# Patient Record
Sex: Female | Born: 1965 | Race: White | Hispanic: No | Marital: Single | Smoking: Never smoker
Health system: Southern US, Community
[De-identification: ages and names within clinical notes are randomized; demographics above are authoritative.]

## PROBLEM LIST (undated history)

## (undated) DIAGNOSIS — T4145XA Adverse effect of unspecified anesthetic, initial encounter: Secondary | ICD-10-CM

## (undated) DIAGNOSIS — Z9889 Other specified postprocedural states: Secondary | ICD-10-CM

## (undated) DIAGNOSIS — E079 Disorder of thyroid, unspecified: Secondary | ICD-10-CM

## (undated) DIAGNOSIS — T8859XA Other complications of anesthesia, initial encounter: Secondary | ICD-10-CM

## (undated) DIAGNOSIS — I10 Essential (primary) hypertension: Secondary | ICD-10-CM

## (undated) DIAGNOSIS — R112 Nausea with vomiting, unspecified: Secondary | ICD-10-CM

## (undated) DIAGNOSIS — E039 Hypothyroidism, unspecified: Secondary | ICD-10-CM

## (undated) HISTORY — PX: ABDOMINAL HYSTERECTOMY: SHX81

## (undated) HISTORY — DX: Essential (primary) hypertension: I10

## (undated) HISTORY — DX: Disorder of thyroid, unspecified: E07.9

---

## 2004-08-24 ENCOUNTER — Ambulatory Visit: Payer: Self-pay | Admitting: Family Medicine

## 2005-01-25 ENCOUNTER — Ambulatory Visit: Payer: Self-pay | Admitting: Internal Medicine

## 2006-03-21 ENCOUNTER — Ambulatory Visit: Payer: Self-pay | Admitting: Internal Medicine

## 2006-08-05 ENCOUNTER — Ambulatory Visit: Payer: Self-pay | Admitting: Internal Medicine

## 2007-03-17 ENCOUNTER — Ambulatory Visit: Payer: Self-pay | Admitting: Family Medicine

## 2007-03-28 ENCOUNTER — Ambulatory Visit: Payer: Self-pay | Admitting: Internal Medicine

## 2007-06-05 ENCOUNTER — Ambulatory Visit: Payer: Self-pay | Admitting: Unknown Physician Specialty

## 2007-06-13 ENCOUNTER — Ambulatory Visit: Payer: Self-pay | Admitting: Unknown Physician Specialty

## 2008-04-04 ENCOUNTER — Ambulatory Visit: Payer: Self-pay | Admitting: Internal Medicine

## 2008-09-02 ENCOUNTER — Ambulatory Visit: Payer: Self-pay | Admitting: Unknown Physician Specialty

## 2008-09-12 ENCOUNTER — Ambulatory Visit: Payer: Self-pay | Admitting: Unknown Physician Specialty

## 2009-04-07 ENCOUNTER — Ambulatory Visit: Payer: Self-pay | Admitting: Internal Medicine

## 2010-06-09 ENCOUNTER — Ambulatory Visit: Payer: Self-pay | Admitting: Internal Medicine

## 2011-08-05 ENCOUNTER — Ambulatory Visit: Payer: Self-pay

## 2012-09-06 ENCOUNTER — Ambulatory Visit: Payer: Self-pay | Admitting: Internal Medicine

## 2012-09-08 ENCOUNTER — Ambulatory Visit: Payer: Self-pay | Admitting: Internal Medicine

## 2013-09-12 ENCOUNTER — Ambulatory Visit: Payer: Self-pay

## 2014-04-24 DIAGNOSIS — I340 Nonrheumatic mitral (valve) insufficiency: Secondary | ICD-10-CM | POA: Insufficient documentation

## 2014-08-21 DIAGNOSIS — N8185 Cervical stump prolapse: Secondary | ICD-10-CM | POA: Insufficient documentation

## 2014-08-21 DIAGNOSIS — B3731 Acute candidiasis of vulva and vagina: Secondary | ICD-10-CM | POA: Insufficient documentation

## 2014-08-21 DIAGNOSIS — B373 Candidiasis of vulva and vagina: Secondary | ICD-10-CM | POA: Insufficient documentation

## 2015-03-27 DIAGNOSIS — E782 Mixed hyperlipidemia: Secondary | ICD-10-CM | POA: Insufficient documentation

## 2015-03-27 DIAGNOSIS — I10 Essential (primary) hypertension: Secondary | ICD-10-CM | POA: Insufficient documentation

## 2017-06-07 ENCOUNTER — Ambulatory Visit: Payer: Self-pay | Admitting: Nurse Practitioner

## 2017-07-14 ENCOUNTER — Ambulatory Visit: Payer: BLUE CROSS/BLUE SHIELD | Admitting: Nurse Practitioner

## 2017-07-14 ENCOUNTER — Encounter: Payer: Self-pay | Admitting: Nurse Practitioner

## 2017-07-14 DIAGNOSIS — I10 Essential (primary) hypertension: Secondary | ICD-10-CM

## 2017-07-14 DIAGNOSIS — E079 Disorder of thyroid, unspecified: Secondary | ICD-10-CM | POA: Diagnosis not present

## 2017-07-14 NOTE — Progress Notes (Signed)
[facility] [place], [place] [address]  Internal MEDICINE  Office Visit Note  Patient Name: Theresa Yang  [id number]  [id number]  Date of Service: 07/24/2017  Chief Complaint  Patient presents with  . Hypertension    Hypertension  This is a chronic problem. The current episode started more than 1 year ago. The problem is unchanged. The problem is controlled. Pertinent negatives include no chest pain, neck pain, orthopnea, palpitations or shortness of breath. There are no associated agents to hypertension. Past treatments include calcium channel blockers, diuretics and lifestyle changes. The current treatment provides moderate improvement. Identifiable causes of hypertension include a thyroid problem.    Pt is here for routine follow up.    Current Medication: Outpatient Encounter Medications as of 07/14/2017  Medication Sig  . nystatin cream (MYCOSTATIN)   . desonide (DESOWEN) 0.05 % cream Apply topically.  Marland Kitchen levothyroxine (SYNTHROID, LEVOTHROID) 50 MCG tablet   . telmisartan-hydrochlorothiazide (MICARDIS HCT) 80-12.5 MG tablet    No facility-administered encounter medications on file as of 07/14/2017.     Surgical History: History reviewed. No pertinent surgical history.  Medical History: Past Medical History:  Diagnosis Date  . Hypertension   . Thyroid disease     Family History: Family History  Problem Relation Age of Onset  . Diabetes Mother   . Hypothyroidism Father   . Cancer Father     Social History   Socioeconomic History  . Marital status: Single    Spouse name: Not on file  . Number of children: Not on file  . Years of education: Not on file  . Highest education level: Not on file  Social Needs  . Financial resource strain: Not on file  . Food insecurity - worry: Not on file  . Food insecurity - inability: Not on file  . Transportation needs - medical: Not on file  . Transportation needs - non-medical: Not on  file  Occupational History  . Not on file  Tobacco Use  . Smoking status: Never Smoker  . Smokeless tobacco: Never Used  Substance and Sexual Activity  . Alcohol use: No    Frequency: Never  . Drug use: No  . Sexual activity: Not on file  Other Topics Concern  . Not on file  Social History Narrative  . Not on file      Review of Systems  Constitutional: Negative for activity change, chills, fatigue, fever and unexpected weight change.  HENT: Negative for congestion, postnasal drip, rhinorrhea, sneezing and sore throat.   Eyes: Negative.  Negative for redness.  Respiratory: Negative for cough, chest tightness and shortness of breath.   Cardiovascular: Negative for chest pain, palpitations and orthopnea.  Gastrointestinal: Negative for abdominal pain, constipation, diarrhea, nausea and vomiting.  Endocrine:       Well controlled hypothyroid  Genitourinary: Negative.  Negative for dysuria and frequency.  Musculoskeletal: Negative for arthralgias, back pain, joint swelling and neck pain.  Skin: Negative for rash.  Allergic/Immunologic: Negative for environmental allergies and immunocompromised state.  Neurological: Negative.  Negative for tremors and numbness.  Hematological: Negative for adenopathy. Does not bruise/bleed easily.  Psychiatric/Behavioral: Negative for behavioral problems (Depression), sleep disturbance and suicidal ideas. The patient is not nervous/anxious.     Today's Vitals   07/14/17 1608  BP: 119/65  Pulse: 74  Resp: 16  SpO2: 100%  Weight: 157 lb (71.2 kg)  Height: 5\' 2"  (1.575 m)    Physical Exam  Constitutional: She is oriented  to person, place, and time. She appears well-developed and well-nourished. No distress.  HENT:  Head: Normocephalic and atraumatic.  Mouth/Throat: Oropharynx is clear and moist. No oropharyngeal exudate.  Eyes: EOM are normal. Pupils are equal, round, and reactive to light.  Neck: Normal range of motion. Neck supple. No  JVD present. Carotid bruit is not present. No tracheal deviation present. No thyromegaly present.  Cardiovascular: Normal rate, regular rhythm and normal heart sounds. Exam reveals no gallop and no friction rub.  No murmur heard. Pulmonary/Chest: Effort normal and breath sounds normal. No respiratory distress. She has no wheezes. She has no rales. She exhibits no tenderness.  Abdominal: Soft. Bowel sounds are normal. There is no tenderness.  Musculoskeletal: Normal range of motion.  Lymphadenopathy:    She has no cervical adenopathy.  Neurological: She is alert and oriented to person, place, and time. No cranial nerve deficit.  Skin: Skin is warm and dry. She is not diaphoretic.  Psychiatric: She has a normal mood and affect. Her behavior is normal. Judgment and thought content normal.  Nursing note and vitals reviewed.  Assessment/Plan: 1. Essential hypertension Stable. Continue bp medication as prescribed.   2. Thyroid disease Continue levothyroxine as prescribed.   General Counseling: Theresa Yang understanding of the findings of todays visit and agrees with plan of treatment. I have discussed any further diagnostic evaluation that may be needed or ordered today. We also reviewed her medications today. she has been encouraged to call the office with any questions or concerns that should arise related to todays visit.    Time spent: 43 Minutes   Dr Lavera Guise Internal medicine

## 2017-07-24 ENCOUNTER — Encounter: Payer: Self-pay | Admitting: Nurse Practitioner

## 2017-07-24 DIAGNOSIS — I10 Essential (primary) hypertension: Secondary | ICD-10-CM | POA: Insufficient documentation

## 2017-07-24 DIAGNOSIS — E079 Disorder of thyroid, unspecified: Secondary | ICD-10-CM | POA: Insufficient documentation

## 2017-09-01 ENCOUNTER — Ambulatory Visit: Payer: BLUE CROSS/BLUE SHIELD | Admitting: Nurse Practitioner

## 2017-09-01 ENCOUNTER — Encounter: Payer: Self-pay | Admitting: Nurse Practitioner

## 2017-09-01 VITALS — BP 129/67 | HR 73 | Resp 16 | Ht 62.0 in | Wt 157.6 lb

## 2017-09-01 DIAGNOSIS — Z124 Encounter for screening for malignant neoplasm of cervix: Secondary | ICD-10-CM | POA: Diagnosis not present

## 2017-09-01 DIAGNOSIS — N8110 Cystocele, unspecified: Secondary | ICD-10-CM

## 2017-09-01 DIAGNOSIS — R300 Dysuria: Secondary | ICD-10-CM | POA: Diagnosis not present

## 2017-09-01 DIAGNOSIS — E559 Vitamin D deficiency, unspecified: Secondary | ICD-10-CM | POA: Diagnosis not present

## 2017-09-01 DIAGNOSIS — Z0001 Encounter for general adult medical examination with abnormal findings: Secondary | ICD-10-CM

## 2017-09-01 DIAGNOSIS — I10 Essential (primary) hypertension: Secondary | ICD-10-CM

## 2017-09-01 DIAGNOSIS — E079 Disorder of thyroid, unspecified: Secondary | ICD-10-CM | POA: Diagnosis not present

## 2017-09-01 DIAGNOSIS — Z1211 Encounter for screening for malignant neoplasm of colon: Secondary | ICD-10-CM

## 2017-09-01 DIAGNOSIS — B372 Candidiasis of skin and nail: Secondary | ICD-10-CM

## 2017-09-01 MED ORDER — NYSTATIN 100000 UNIT/GM EX CREA: TOPICAL_CREAM | Freq: Three times a day (TID) | CUTANEOUS | 3 refills | Status: DC

## 2017-09-01 NOTE — Progress Notes (Signed)
[facility] [place], [place] [address]  Internal MEDICINE  Office Visit Note  Patient Name: Theresa Yang  [id number]  [id number]  Date of Service: 09/23/2017   Pt is here for routine health maintenance examination  Chief Complaint  Patient presents with  . Annual Exam  . Gynecologic Exam  . Hypertension     Hypertension  This is a chronic problem. The current episode started more than 1 year ago. The problem has been gradually improving since onset. The problem is controlled. Pertinent negatives include no chest pain, headaches, neck pain, palpitations or shortness of breath. Agents associated with hypertension include thyroid hormones. Risk factors for coronary artery disease include sedentary lifestyle and obesity. Past treatments include angiotensin blockers and diuretics. The current treatment provides moderate improvement. Compliance problems include exercise.      Current Medication: Outpatient Encounter Medications as of 09/01/2017  Medication Sig  . desonide (DESOWEN) 0.05 % cream Apply topically.  Marland Kitchen levothyroxine (SYNTHROID, LEVOTHROID) 50 MCG tablet   . nystatin cream (MYCOSTATIN) Apply topically 3 (three) times daily.  Marland Kitchen telmisartan-hydrochlorothiazide (MICARDIS HCT) 80-12.5 MG tablet   . [DISCONTINUED] nystatin cream (MYCOSTATIN)    No facility-administered encounter medications on file as of 09/01/2017.     Surgical History: History reviewed. No pertinent surgical history.  Medical History: Past Medical History:  Diagnosis Date  . Hypertension   . Thyroid disease     Family History: Family History  Problem Relation Age of Onset  . Diabetes Mother   . Hypothyroidism Father   . Cancer Father       Review of Systems  Constitutional: Negative for activity change, chills, fatigue, fever and unexpected weight change.  HENT: Negative for congestion, postnasal drip, rhinorrhea, sneezing, sore throat and voice change.    Eyes: Negative.  Negative for redness.  Respiratory: Negative for cough, chest tightness, shortness of breath and wheezing.   Cardiovascular: Negative for chest pain, palpitations and leg swelling.  Gastrointestinal: Negative for abdominal pain, constipation, diarrhea, nausea and vomiting.  Endocrine:       Well controlled hypothyroid  Genitourinary: Negative for dysuria, flank pain, frequency, hematuria and vaginal discharge.  Musculoskeletal: Negative for arthralgias, back pain, joint swelling and neck pain.  Skin: Negative for rash.  Allergic/Immunologic: Negative for environmental allergies and immunocompromised state.  Neurological: Negative.  Negative for tremors, numbness and headaches.  Hematological: Negative for adenopathy. Does not bruise/bleed easily.  Psychiatric/Behavioral: Negative for behavioral problems (Depression), sleep disturbance and suicidal ideas. The patient is not nervous/anxious.      Vital Signs: BP 129/67 (BP Location: Right Arm, Patient Position: Sitting, Cuff Size: Normal)   Pulse 73   Resp 16   Ht 5\' 2"  (1.575 m)   Wt 157 lb 9.6 oz (71.5 kg)   SpO2 99%   BMI 28.83 kg/m    Physical Exam  Constitutional: She is oriented to person, place, and time. She appears well-developed and well-nourished. No distress.  HENT:  Head: Normocephalic and atraumatic.  Mouth/Throat: Oropharynx is clear and moist. No oropharyngeal exudate.  Eyes: Pupils are equal, round, and reactive to light. EOM are normal.  Neck: Normal range of motion. Neck supple. No JVD present. Carotid bruit is not present. No tracheal deviation present. No thyromegaly present.  Cardiovascular: Normal rate, regular rhythm, normal heart sounds and intact distal pulses. Exam reveals no gallop and no friction rub.  No murmur heard. Pulmonary/Chest: Effort normal and breath sounds normal. No respiratory distress. She has no wheezes.  She has no rales. She exhibits no tenderness. Right breast exhibits  no inverted nipple, no mass, no nipple discharge, no skin change and no tenderness. Left breast exhibits no inverted nipple, no mass, no nipple discharge, no skin change and no tenderness. Breasts are symmetrical.  Abdominal: Soft. Bowel sounds are normal. There is no tenderness.  Genitourinary: Vagina normal and uterus normal. Pelvic exam was performed with patient supine. There is no rash, tenderness, lesion or injury on the right labia. There is no rash, tenderness, lesion or injury on the left labia. Cervix exhibits no motion tenderness, no discharge and no friability. Right adnexum displays no mass and no tenderness. Left adnexum displays no mass and no tenderness.  Genitourinary Comments: No tenderness, masses, or organomegaly present during bimanual exam. Moderate bladder prolapse present.   Musculoskeletal: Normal range of motion.  Lymphadenopathy:    She has no cervical adenopathy.  Neurological: She is alert and oriented to person, place, and time. No cranial nerve deficit.  Skin: Skin is warm and dry. She is not diaphoretic.  Psychiatric: She has a normal mood and affect. Her behavior is normal. Judgment and thought content normal.  Nursing note and vitals reviewed.  LABS: Recent Results (from the past 2160 hour(s))  Pap IG and HPV (high risk) DNA detection     Status: None   Collection Time: 09/01/17 11:34 AM  Result Value Ref Range   DIAGNOSIS: Comment     Comment: NEGATIVE FOR INTRAEPITHELIAL LESION OR MALIGNANCY.   Specimen adequacy: Comment     Comment: Satisfactory for evaluation. Endocervical and/or squamous metaplastic cells (endocervical component) are present.    Clinician Provided ICD10 Comment     Comment: Z12.4   Performed by: Comment     Comment: Shon Baton, Cytotechnologist (ASCP)   PAP Smear Comment .    Note: Comment     Comment: The Pap smear is a screening test designed to aid in the detection of premalignant and malignant conditions of the uterine cervix.   It is not a diagnostic procedure and should not be used as the sole means of detecting cervical cancer.  Both false-positive and false-negative reports do occur.    Test Methodology Comment     Comment: This liquid based ThinPrep(R) pap test was screened with the use of an image guided system.    HPV, high-risk Negative Negative    Comment: This high-risk HPV test detects thirteen high-risk types (16/18/31/33/35/39/45/51/52/56/58/59/68) without differentiation.   Urinalysis, Routine w reflex microscopic     Status: Abnormal   Collection Time: 09/01/17 11:35 AM  Result Value Ref Range   Specific Gravity, UA 1.005 1.005 - 1.030   pH, UA 7.5 5.0 - 7.5   Color, UA Yellow Yellow   Appearance Ur Clear Clear   Leukocytes, UA 2+ (A) Negative   Protein, UA Negative Negative/Trace   Glucose, UA Negative Negative   Ketones, UA Negative Negative   RBC, UA Negative Negative   Bilirubin, UA Negative Negative   Urobilinogen, Ur 0.2 0.2 - 1.0 mg/dL   Nitrite, UA Negative Negative   Microscopic Examination See below:     Comment: Microscopic was indicated and was performed.  Microscopic Examination     Status: None   Collection Time: 09/01/17 11:35 AM  Result Value Ref Range   WBC, UA 0-5 0 - 5 /hpf   RBC, UA 0-2 0 - 2 /hpf   Epithelial Cells (non renal) 0-10 0 - 10 /hpf   Casts None seen None  seen /lpf   Mucus, UA Present Not Estab.   Bacteria, UA Few None seen/Few    Assessment/Plan: 1. Encounter for general adult medical examination with abnormal findings Annual wellness visit today. - CBC with Differential/Platelet - Comprehensive metabolic panel - T4, free - TSH - Lipid panel - Vitamin D 1,25 dihydroxy  2. Thyroid disease Check thyroid panel and adjust levothyroxine as indicated.  - T4, free - TSH  3. Essential hypertension Improved. Continue bp medication as prescribed.  - Comprehensive metabolic panel - Lipid panel  4. Bladder prolapse, female, acquired Not  currently causing any problems. Will monitor for now. reffer to urogynecology as indicated.   5. Cutaneous candidiasis - nystatin cream (MYCOSTATIN); Apply topically 3 (three) times daily.  Dispense: 30 g; Refill: 3  6. Dysuria - Urinalysis, Routine w reflex microscopic  7. Routine cervical smear - Pap IG and HPV (high risk) DNA detection  8. Vitamin D deficiency - Vitamin D 1,25 dihydroxy  General Counseling: Loraine verbalizes understanding of the findings of todays visit and agrees with plan of treatment. I have discussed any further diagnostic evaluation that may be needed or ordered today. We also reviewed her medications today. she has been encouraged to call the office with any questions or concerns that should arise related to todays visit.  Hypertension Counseling:   The following hypertensive lifestyle modification were recommended and discussed:  1. Limiting alcohol intake to less than 1 oz/day of ethanol:(24 oz of beer or 8 oz of wine or 2 oz of 100-proof whiskey). 2. Take baby ASA 81 mg daily. 3. Importance of regular aerobic exercise and losing weight. 4. Reduce dietary saturated fat and cholesterol intake for overall cardiovascular health. 5. Maintaining adequate dietary potassium, calcium, and magnesium intake. 6. Regular monitoring of the blood pressure. 7. Reduce sodium intake to less than 100 mmol/day (less than 2.3 gm of sodium or less than 6 gm of sodium choride)   This patient was seen by Leretha Pol, FNP- C in Collaboration with Dr Lavera Guise as a part of collaborative care agreement    Orders Placed This Encounter  Procedures  . Microscopic Examination  . Urinalysis, Routine w reflex microscopic  . CBC with Differential/Platelet  . Comprehensive metabolic panel  . T4, free  . TSH  . Lipid panel  . Vitamin D 1,25 dihydroxy    Meds ordered this encounter  Medications  . nystatin cream (MYCOSTATIN)    Sig: Apply topically 3 (three) times daily.     Dispense:  30 g    Refill:  3    Order Specific Question:   Supervising Provider    Answer:   Lavera Guise [[id number]]    Time spent: [place], MD  Internal Medicine

## 2017-09-02 LAB — MICROSCOPIC EXAMINATION: Casts: NONE SEEN /lpf

## 2017-09-02 LAB — URINALYSIS, ROUTINE W REFLEX MICROSCOPIC
BILIRUBIN UA: NEGATIVE
Glucose, UA: NEGATIVE
KETONES UA: NEGATIVE
Nitrite, UA: NEGATIVE
Protein, UA: NEGATIVE
RBC UA: NEGATIVE
SPEC GRAV UA: 1.005 (ref 1.005–1.030)
UUROB: 0.2 mg/dL (ref 0.2–1.0)
pH, UA: 7.5 (ref 5.0–7.5)

## 2017-09-03 LAB — PAP IG AND HPV HIGH-RISK
HPV, high-risk: NEGATIVE
PAP Smear Comment: 0

## 2017-09-12 NOTE — Progress Notes (Signed)
Pt was notified.  

## 2017-09-23 DIAGNOSIS — R300 Dysuria: Secondary | ICD-10-CM | POA: Insufficient documentation

## 2017-09-23 DIAGNOSIS — B372 Candidiasis of skin and nail: Secondary | ICD-10-CM | POA: Insufficient documentation

## 2017-09-23 DIAGNOSIS — E559 Vitamin D deficiency, unspecified: Secondary | ICD-10-CM | POA: Insufficient documentation

## 2017-09-23 DIAGNOSIS — N8110 Cystocele, unspecified: Secondary | ICD-10-CM | POA: Insufficient documentation

## 2017-10-06 ENCOUNTER — Other Ambulatory Visit: Payer: Self-pay | Admitting: Nurse Practitioner

## 2017-10-07 LAB — COMPREHENSIVE METABOLIC PANEL
ALT: 22 IU/L (ref 0–32)
AST: 19 IU/L (ref 0–40)
Albumin/Globulin Ratio: 2 (ref 1.2–2.2)
Albumin: 4.5 g/dL (ref 3.5–5.5)
Alkaline Phosphatase: 66 IU/L (ref 39–117)
BUN/Creatinine Ratio: 15 (ref 9–23)
BUN: 14 mg/dL (ref 6–24)
Bilirubin Total: 0.5 mg/dL (ref 0.0–1.2)
CALCIUM: 9.7 mg/dL (ref 8.7–10.2)
CO2: 25 mmol/L (ref 20–29)
Chloride: 100 mmol/L (ref 96–106)
Creatinine, Ser: 0.93 mg/dL (ref 0.57–1.00)
GFR, EST AFRICAN AMERICAN: 83 mL/min/{1.73_m2} (ref >59)
GFR, EST NON AFRICAN AMERICAN: 72 mL/min/{1.73_m2} (ref >59)
Globulin, Total: 2.2 g/dL (ref 1.5–4.5)
Glucose: 92 mg/dL (ref 65–99)
POTASSIUM: 4.9 mmol/L (ref 3.5–5.2)
SODIUM: 139 mmol/L (ref 134–144)
Total Protein: 6.7 g/dL (ref 6.0–8.5)

## 2017-10-07 LAB — LIPID PANEL W/O CHOL/HDL RATIO
CHOLESTEROL TOTAL: 169 mg/dL (ref 100–199)
HDL: 52 mg/dL (ref >39)
LDL Calculated: 106 mg/dL — ABNORMAL HIGH (ref 0–99)
Triglycerides: 54 mg/dL (ref 0–149)
VLDL Cholesterol Cal: 11 mg/dL (ref 5–40)

## 2017-10-07 LAB — CBC
Hematocrit: 39.5 % (ref 34.0–46.6)
Hemoglobin: 13.5 g/dL (ref 11.1–15.9)
MCH: 32.4 pg (ref 26.6–33.0)
MCHC: 34.2 g/dL (ref 31.5–35.7)
MCV: 95 fL (ref 79–97)
PLATELETS: 191 10*3/uL (ref 150–379)
RBC: 4.17 x10E6/uL (ref 3.77–5.28)
RDW: 12.7 % (ref 12.3–15.4)
WBC: 5.1 10*3/uL (ref 3.4–10.8)

## 2017-10-07 LAB — VITAMIN D 25 HYDROXY (VIT D DEFICIENCY, FRACTURES): Vit D, 25-Hydroxy: 31.4 ng/mL (ref 30.0–100.0)

## 2017-10-07 LAB — TSH: TSH: 3.94 u[IU]/mL (ref 0.450–4.500)

## 2017-10-07 LAB — T4, FREE: Free T4: 1.33 ng/dL (ref 0.82–1.77)

## 2018-03-10 ENCOUNTER — Other Ambulatory Visit: Payer: Self-pay

## 2018-03-10 MED ORDER — LEVOTHYROXINE SODIUM 50 MCG PO TABS: 50.0000 ug | ORAL_TABLET | Freq: Every day | ORAL | 1 refills | Status: DC

## 2018-03-23 ENCOUNTER — Ambulatory Visit: Payer: Self-pay | Admitting: Nurse Practitioner

## 2018-03-30 ENCOUNTER — Encounter: Payer: Self-pay | Admitting: Nurse Practitioner

## 2018-03-30 ENCOUNTER — Ambulatory Visit: Payer: BLUE CROSS/BLUE SHIELD | Admitting: Nurse Practitioner

## 2018-03-30 VITALS — BP 109/63 | HR 61 | Resp 16 | Ht 62.0 in | Wt 163.2 lb

## 2018-03-30 DIAGNOSIS — Z1211 Encounter for screening for malignant neoplasm of colon: Secondary | ICD-10-CM | POA: Diagnosis not present

## 2018-03-30 DIAGNOSIS — I10 Essential (primary) hypertension: Secondary | ICD-10-CM

## 2018-03-30 DIAGNOSIS — E079 Disorder of thyroid, unspecified: Secondary | ICD-10-CM

## 2018-03-30 DIAGNOSIS — Z1239 Encounter for other screening for malignant neoplasm of breast: Secondary | ICD-10-CM

## 2018-03-30 NOTE — Progress Notes (Signed)
[facility] [place], [place] [address]  Internal MEDICINE  Office Visit Note  Patient Name: Theresa Yang  [id number]  [id number]  Date of Service: 03/30/2018  Chief Complaint  Patient presents with  . Medical Management of Chronic Issues    6 month follow up  . Hypertension  . Quality Metric Gaps    pt stated she will get the flu vaccine from her pharmacy    The patient is here for routine follow up visit. She is due to have screening mammogram. She also needs to have screening colonoscopy.  Hypertension  This is a chronic problem. The current episode started more than 1 year ago. The problem is unchanged. The problem is controlled. Pertinent negatives include no chest pain, headaches, neck pain, palpitations or shortness of breath. There are no associated agents to hypertension. Risk factors for coronary artery disease include obesity and post-menopausal state. Past treatments include angiotensin blockers. The current treatment provides moderate improvement. Compliance problems include exercise.        Current Medication: Outpatient Encounter Medications as of 03/30/2018  Medication Sig  . clotrimazole-betamethasone (LOTRISONE) cream Apply 1 application topically 2 (two) times daily.  Marland Kitchen desonide (DESOWEN) 0.05 % cream Apply topically.  . fluocinonide cream (LIDEX) 2.68 % Apply 1 application topically 2 (two) times daily.  Marland Kitchen levothyroxine (SYNTHROID, LEVOTHROID) 50 MCG tablet Take 1 tablet (50 mcg total) by mouth daily before breakfast.  . nystatin cream (MYCOSTATIN) Apply topically 3 (three) times daily.  Marland Kitchen telmisartan-hydrochlorothiazide (MICARDIS HCT) 80-12.5 MG tablet    No facility-administered encounter medications on file as of 03/30/2018.     Surgical History: History reviewed. No pertinent surgical history.  Medical History: Past Medical History:  Diagnosis Date  . Hypertension   . Thyroid disease     Family  History: Family History  Problem Relation Age of Onset  . Diabetes Mother   . Hypothyroidism Father   . Cancer Father     Social History   Socioeconomic History  . Marital status: Single    Spouse name: Not on file  . Number of children: Not on file  . Years of education: Not on file  . Highest education level: Not on file  Occupational History  . Not on file  Social Needs  . Financial resource strain: Not on file  . Food insecurity:    Worry: Not on file    Inability: Not on file  . Transportation needs:    Medical: Not on file    Non-medical: Not on file  Tobacco Use  . Smoking status: Never Smoker  . Smokeless tobacco: Never Used  Substance and Sexual Activity  . Alcohol use: No    Frequency: Never  . Drug use: No  . Sexual activity: Not on file  Lifestyle  . Physical activity:    Days per week: Not on file    Minutes per session: Not on file  . Stress: Not on file  Relationships  . Social connections:    Talks on phone: Not on file    Gets together: Not on file    Attends religious service: Not on file    Active member of club or organization: Not on file    Attends meetings of clubs or organizations: Not on file    Relationship status: Not on file  . Intimate partner violence:    Fear of current or ex partner: Not on file    Emotionally abused: Not on file  Physically abused: Not on file    Forced sexual activity: Not on file  Other Topics Concern  . Not on file  Social History Narrative  . Not on file      Review of Systems  Constitutional: Negative for activity change, chills, fatigue, fever and unexpected weight change.  HENT: Negative for congestion, postnasal drip, rhinorrhea, sneezing, sore throat and voice change.   Eyes: Negative.  Negative for redness.  Respiratory: Negative for cough, chest tightness, shortness of breath and wheezing.   Cardiovascular: Negative for chest pain, palpitations and leg swelling.  Gastrointestinal: Negative  for abdominal pain, constipation, diarrhea, nausea and vomiting.  Endocrine:       Recent thyroid panel was normal  Musculoskeletal: Negative for arthralgias, back pain, joint swelling and neck pain.  Skin: Negative for rash.  Allergic/Immunologic: Negative for environmental allergies and immunocompromised state.  Neurological: Negative.  Negative for tremors, numbness and headaches.  Hematological: Negative for adenopathy. Does not bruise/bleed easily.  Psychiatric/Behavioral: Negative for behavioral problems (Depression), sleep disturbance and suicidal ideas. The patient is not nervous/anxious.     Today's Vitals   03/30/18 1452  BP: 109/63  Pulse: 61  Resp: 16  SpO2: 98%  Weight: 163 lb 3.2 oz (74 kg)  Height: 5\' 2"  (1.575 m)    Physical Exam  Constitutional: She is oriented to person, place, and time. She appears well-developed and well-nourished. No distress.  HENT:  Head: Normocephalic and atraumatic.  Mouth/Throat: Oropharynx is clear and moist. No oropharyngeal exudate.  Eyes: Pupils are equal, round, and reactive to light. Conjunctivae and EOM are normal.  Neck: Normal range of motion. Neck supple. No JVD present. Carotid bruit is not present. No tracheal deviation present. No thyromegaly present.  Cardiovascular: Normal rate, regular rhythm and normal heart sounds. Exam reveals no gallop and no friction rub.  No murmur heard. Pulmonary/Chest: Effort normal and breath sounds normal. No respiratory distress. She has no wheezes. She has no rales. She exhibits no tenderness.  Abdominal: Soft. Bowel sounds are normal. There is no tenderness.  Musculoskeletal: Normal range of motion.  Lymphadenopathy:    She has no cervical adenopathy.  Neurological: She is alert and oriented to person, place, and time. No cranial nerve deficit.  Skin: Skin is warm and dry. She is not diaphoretic.  Psychiatric: She has a normal mood and affect. Her behavior is normal. Judgment and thought  content normal.  Nursing note and vitals reviewed.  Assessment/Plan: 1. Essential hypertension Stable. Continue bp medication as prescribed   2. Thyroid disease Recent thyroid panel normal. Continue levothyroxine at current dose   3. Screening for breast cancer - MM DIGITAL SCREENING BILATERAL; Future  4. Screening for colon cancer Refer to GI for screening colonoscopy - Ambulatory referral to Gastroenterology  General Counseling: [name] understanding of the findings of todays visit and agrees with plan of treatment. I have discussed any further diagnostic evaluation that may be needed or ordered today. We also reviewed her medications today. she has been encouraged to call the office with any questions or concerns that should arise related to todays visit.  Hypertension Counseling:   The following hypertensive lifestyle modification were recommended and discussed:  1. Limiting alcohol intake to less than 1 oz/day of ethanol:(24 oz of beer or 8 oz of wine or 2 oz of 100-proof whiskey). 2. Take baby ASA 81 mg daily. 3. Importance of regular aerobic exercise and losing weight. 4. Reduce dietary saturated fat and cholesterol intake for overall  cardiovascular health. 5. Maintaining adequate dietary potassium, calcium, and magnesium intake. 6. Regular monitoring of the blood pressure. 7. Reduce sodium intake to less than 100 mmol/day (less than 2.3 gm of sodium or less than 6 gm of sodium choride)   This patient was seen by [place] with Dr Lavera Guise as a part of collaborative care agreement  Orders Placed This Encounter  Procedures  . MM DIGITAL SCREENING BILATERAL  . Ambulatory referral to Gastroenterology    Time spent: [place] Internal medicine

## 2018-04-03 ENCOUNTER — Other Ambulatory Visit: Payer: Self-pay

## 2018-04-03 DIAGNOSIS — Z1211 Encounter for screening for malignant neoplasm of colon: Secondary | ICD-10-CM

## 2018-04-13 ENCOUNTER — Encounter (HOSPITAL_COMMUNITY): Payer: Self-pay

## 2018-04-13 ENCOUNTER — Ambulatory Visit
Admission: RE | Admit: 2018-04-13 | Discharge: 2018-04-13 | Disposition: A | Discharge status: Home / Self Care | Payer: BLUE CROSS/BLUE SHIELD | Source: Ambulatory Visit | Attending: Nurse Practitioner | Admitting: Nurse Practitioner

## 2018-04-13 ENCOUNTER — Other Ambulatory Visit: Payer: Self-pay | Admitting: Nurse Practitioner

## 2018-04-13 DIAGNOSIS — Z1239 Encounter for other screening for malignant neoplasm of breast: Secondary | ICD-10-CM | POA: Diagnosis present

## 2018-04-14 ENCOUNTER — Other Ambulatory Visit: Payer: Self-pay | Admitting: Nurse Practitioner

## 2018-04-14 DIAGNOSIS — N6320 Unspecified lump in the left breast, unspecified quadrant: Secondary | ICD-10-CM

## 2018-04-14 DIAGNOSIS — R928 Other abnormal and inconclusive findings on diagnostic imaging of breast: Secondary | ICD-10-CM

## 2018-04-20 ENCOUNTER — Ambulatory Visit: Admit: 2018-04-20 | Payer: BLUE CROSS/BLUE SHIELD | Admitting: Gastroenterology

## 2018-04-20 SURGERY — COLONOSCOPY WITH PROPOFOL
Anesthesia: General

## 2018-04-21 ENCOUNTER — Ambulatory Visit
Admission: RE | Admit: 2018-04-21 | Discharge: 2018-04-21 | Disposition: A | Discharge status: Home / Self Care | Payer: BLUE CROSS/BLUE SHIELD | Source: Ambulatory Visit | Attending: Nurse Practitioner | Admitting: Nurse Practitioner

## 2018-04-21 DIAGNOSIS — R928 Other abnormal and inconclusive findings on diagnostic imaging of breast: Secondary | ICD-10-CM

## 2018-04-21 DIAGNOSIS — N6320 Unspecified lump in the left breast, unspecified quadrant: Secondary | ICD-10-CM | POA: Diagnosis present

## 2018-06-06 ENCOUNTER — Telehealth: Payer: Self-pay | Admitting: Gastroenterology

## 2018-06-06 NOTE — Telephone Encounter (Signed)
Pt is calling to r/s her procedure

## 2018-06-07 NOTE — Telephone Encounter (Signed)
LVM asking patient to call office back to schedule Screening colonoscopy.  New Referral will need to be created.  Thanks [employer]

## 2018-06-08 ENCOUNTER — Other Ambulatory Visit: Payer: Self-pay

## 2018-06-08 DIAGNOSIS — Z1211 Encounter for screening for malignant neoplasm of colon: Secondary | ICD-10-CM

## 2018-07-03 ENCOUNTER — Other Ambulatory Visit: Payer: Self-pay

## 2018-07-03 MED ORDER — PEG 3350-KCL-NA BICARB-NACL 420 G PO SOLR: 4000.0000 mL | Freq: Once | ORAL | 0 refills | Status: AC

## 2018-07-12 ENCOUNTER — Ambulatory Visit: Payer: BLUE CROSS/BLUE SHIELD | Admitting: Certified Registered"

## 2018-07-12 ENCOUNTER — Other Ambulatory Visit: Payer: Self-pay

## 2018-07-12 ENCOUNTER — Encounter: Payer: Self-pay | Admitting: Anesthesiology

## 2018-07-12 ENCOUNTER — Ambulatory Visit
Admission: RE | Admit: 2018-07-12 | Discharge: 2018-07-12 | Disposition: A | Discharge status: Home / Self Care | Payer: BLUE CROSS/BLUE SHIELD | Attending: Gastroenterology | Admitting: Gastroenterology

## 2018-07-12 ENCOUNTER — Encounter
Admission: RE | Disposition: A | Discharge status: Home / Self Care | Payer: Self-pay | Source: Home / Self Care | Attending: Gastroenterology

## 2018-07-12 DIAGNOSIS — I10 Essential (primary) hypertension: Secondary | ICD-10-CM | POA: Diagnosis not present

## 2018-07-12 DIAGNOSIS — Z79899 Other long term (current) drug therapy: Secondary | ICD-10-CM | POA: Diagnosis not present

## 2018-07-12 DIAGNOSIS — D120 Benign neoplasm of cecum: Secondary | ICD-10-CM

## 2018-07-12 DIAGNOSIS — E039 Hypothyroidism, unspecified: Secondary | ICD-10-CM | POA: Insufficient documentation

## 2018-07-12 DIAGNOSIS — Z1211 Encounter for screening for malignant neoplasm of colon: Secondary | ICD-10-CM | POA: Diagnosis not present

## 2018-07-12 DIAGNOSIS — D122 Benign neoplasm of ascending colon: Secondary | ICD-10-CM | POA: Insufficient documentation

## 2018-07-12 DIAGNOSIS — Z79890 Hormone replacement therapy (postmenopausal): Secondary | ICD-10-CM | POA: Diagnosis not present

## 2018-07-12 HISTORY — DX: Adverse effect of unspecified anesthetic, initial encounter: T41.45XA

## 2018-07-12 HISTORY — DX: Other specified postprocedural states: Z98.890

## 2018-07-12 HISTORY — PX: COLONOSCOPY WITH PROPOFOL: SHX5780

## 2018-07-12 HISTORY — DX: Other complications of anesthesia, initial encounter: T88.59XA

## 2018-07-12 HISTORY — DX: Hypothyroidism, unspecified: E03.9

## 2018-07-12 HISTORY — DX: Other specified postprocedural states: R11.2

## 2018-07-12 LAB — HM COLONOSCOPY

## 2018-07-12 SURGERY — COLONOSCOPY WITH PROPOFOL
Anesthesia: General

## 2018-07-12 MED ORDER — PROPOFOL 10 MG/ML IV BOLUS
INTRAVENOUS | Status: DC | PRN
  Administered 2018-07-12 (×11): 20 mg via INTRAVENOUS
  Administered 2018-07-12: 30 mg via INTRAVENOUS
  Administered 2018-07-12: 20 mg via INTRAVENOUS
  Administered 2018-07-12: 50 mg via INTRAVENOUS

## 2018-07-12 MED ORDER — EPHEDRINE SULFATE 50 MG/ML IJ SOLN
INTRAMUSCULAR | Status: DC | PRN
  Administered 2018-07-12 (×3): 10 mg via INTRAVENOUS

## 2018-07-12 MED ORDER — GLYCOPYRROLATE 0.2 MG/ML IJ SOLN
INTRAMUSCULAR | Status: DC | PRN
  Administered 2018-07-12: 0.2 mg via INTRAVENOUS

## 2018-07-12 MED ORDER — SODIUM CHLORIDE 0.9 % IV SOLN
INTRAVENOUS | Status: DC
  Administered 2018-07-12: 13:00:00 via INTRAVENOUS

## 2018-07-12 MED ORDER — PHENYLEPHRINE HCL 10 MG/ML IJ SOLN
INTRAMUSCULAR | Status: DC | PRN
  Administered 2018-07-12: 100 ug via INTRAVENOUS

## 2018-07-12 NOTE — Anesthesia Post-op Follow-up Note (Signed)
Anesthesia QCDR form completed.        

## 2018-07-12 NOTE — Anesthesia Postprocedure Evaluation (Signed)
Anesthesia Post Note  Patient: Theresa Yang  Procedure(s) Performed: COLONOSCOPY WITH PROPOFOL (N/A )  Patient location during evaluation: Endoscopy Anesthesia Type: General Level of consciousness: awake and alert Pain management: pain level controlled Vital Signs Assessment: post-procedure vital signs reviewed and stable Respiratory status: spontaneous breathing, nonlabored ventilation, respiratory function stable and patient connected to nasal cannula oxygen Cardiovascular status: blood pressure returned to baseline and stable Postop Assessment: no apparent nausea or vomiting Anesthetic complications: no     Last Vitals:  Vitals:   07/12/18 1439 07/12/18 1449  BP: (!) 131/58 126/68  Pulse: 82 89  Resp: 20 11  Temp:    SpO2: 100% 100%    Last Pain:  Vitals:   07/12/18 1449  PainSc: 0-No pain                 Precious Haws [place]

## 2018-07-12 NOTE — Op Note (Signed)
[facility] Gastroenterology Patient Name: Theresa Yang Procedure Date: 07/12/2018 7:10 AM MRN: [id number] Account #: [id number] Date of Birth: 21-Nov-1965 Admit Type: Outpatient Age: 53 Room: [facility] ENDO ROOM 1 Gender: Female Note Status: Finalized Procedure:            Colonoscopy Indications:          High risk colon cancer surveillance: Personal history                        of colonic polyps Providers:            Jonathon Bellows MD, MD Referring MD:         No Local Md, MD (Referring MD) Medicines:            Monitored Anesthesia Care Complications:        No immediate complications. Procedure:            Pre-Anesthesia Assessment:                       - Prior to the procedure, a History and Physical was                        performed, and patient medications, allergies and                        sensitivities were reviewed. The patient's tolerance of                        previous anesthesia was reviewed.                       - The risks and benefits of the procedure and the                        sedation options and risks were discussed with the                        patient. All questions were answered and informed                        consent was obtained.                       - ASA Grade Assessment: II - A patient with mild                        systemic disease.                       After obtaining informed consent, the colonoscope was                        passed under direct vision. Throughout the procedure,                        the patient's blood pressure, pulse, and oxygen                        saturations were monitored continuously. The  Colonoscope was introduced through the anus and                        advanced to the the cecum, identified by the                        appendiceal orifice, IC valve and transillumination.                        The colonoscopy was performed with ease. The          colonoscopy was performed with ease. Findings:      The perianal and digital rectal examinations were normal.      Two sessile polyps were found in the proximal ascending colon. The       polyps were 3 to 5 mm in size. These polyps were removed with a cold       biopsy forceps. Resection and retrieval were complete.      The exam was otherwise without abnormality on direct and retroflexion       views. Impression:           - Two 3 to 5 mm polyps in the proximal ascending colon,                        removed with a cold biopsy forceps. Resected and                        retrieved.                       - The examination was otherwise normal on direct and                        retroflexion views. Recommendation:       - Discharge patient to home (with escort).                       - Resume previous diet.                       - Continue present medications.                       - Await pathology results.                       - Repeat colonoscopy in 5 years for surveillance. Procedure Code(s):    --- Professional ---                       [phone], Colonoscopy, flexible; with biopsy, single or                        multiple Diagnosis Code(s):    --- Professional ---                       Z86.010, Personal history of colonic polyps                       D12.2, Benign neoplasm of ascending colon CPT copyright 2018 American Medical Association. All rights reserved. The codes documented in this report are preliminary and upon coder review may  be revised to meet current compliance requirements. Jonathon Bellows, MD Jonathon Bellows MD, MD 07/12/2018 2:06:13 PM This report has been signed electronically. Number of Addenda: 0 Note Initiated On: 07/12/2018 7:10 AM Scope Withdrawal Time: 0 hours 18 minutes 3 seconds  Total Procedure Duration: 0 hours 25 minutes 23 seconds       [facility]

## 2018-07-12 NOTE — Transfer of Care (Signed)
Immediate Anesthesia Transfer of Care Note  Patient: Theresa Yang  Procedure(s) Performed: COLONOSCOPY WITH PROPOFOL (N/A )  Patient Location: Endoscopy Unit  Anesthesia Type:General  Level of Consciousness: awake, alert , oriented and patient cooperative  Airway & Oxygen Therapy: Patient Spontanous Breathing  Post-op Assessment: Report given to RN and Post -op Vital signs reviewed and stable  Post vital signs: Reviewed and stable  Last Vitals:  Vitals Value Taken Time  BP 117/49 07/12/2018  2:09 PM  Temp 36.2 C 07/12/2018  2:08 PM  Pulse 104 07/12/2018  2:09 PM  Resp 18 07/12/2018  2:09 PM  SpO2 99 % 07/12/2018  2:09 PM  Vitals shown include unvalidated device data.  Last Pain:  Vitals:   07/12/18 1227  PainSc: 0-No pain         Complications: No apparent anesthesia complications

## 2018-07-12 NOTE — H&P (Signed)
Jonathon Bellows, MD [address] [place], [place], [place], [address] [address], [place], [place], [place], [address] Phone: [phone]  Fax: [phone]  Primary Care Physician:  Ronnell Freshwater, NP   Pre-Procedure History & Physical: HPI:  Theresa Yang is a 53 y.o. female is here for an colonoscopy.   Past Medical History:  Diagnosis Date  . Complication of anesthesia   . Hypertension   . Hypothyroidism   . PONV (postoperative nausea and vomiting)   . Thyroid disease     Past Surgical History:  Procedure Laterality Date  . ABDOMINAL HYSTERECTOMY      Prior to Admission medications   Medication Sig Start Date End Date Taking? Authorizing Provider  clotrimazole-betamethasone (LOTRISONE) cream Apply 1 application topically 2 (two) times daily.   Yes [provider]  desonide (DESOWEN) 0.05 % cream Apply topically.   Yes [provider]  levothyroxine (SYNTHROID, LEVOTHROID) 50 MCG tablet Take 1 tablet (50 mcg total) by mouth daily before breakfast. 03/10/18  Yes Boscia, Heather E, NP  nystatin cream (MYCOSTATIN) Apply topically 3 (three) times daily. 09/01/17  Yes Ronnell Freshwater, NP  telmisartan-hydrochlorothiazide (MICARDIS HCT) 80-12.5 MG tablet  07/11/17  Yes [provider]  fluocinonide cream (LIDEX) 8.18 % Apply 1 application topically 2 (two) times daily.    [provider]    Allergies as of 06/08/2018  . (No Known Allergies)    Family History  Problem Relation Age of Onset  . Diabetes Mother   . Hypothyroidism Father   . Cancer Father   . Breast cancer Neg Hx     Social History   Socioeconomic History  . Marital status: Single    Spouse name: Not on file  . Number of children: Not on file  . Years of education: Not on file  . Highest education level: Not on file  Occupational History  . Not on file  Social Needs  . Financial resource strain: Not on file  . Food insecurity:    Worry: Not on  file    Inability: Not on file  . Transportation needs:    Medical: Not on file    Non-medical: Not on file  Tobacco Use  . Smoking status: Never Smoker  . Smokeless tobacco: Never Used  Substance and Sexual Activity  . Alcohol use: No    Frequency: Never  . Drug use: No  . Sexual activity: Not on file  Lifestyle  . Physical activity:    Days per week: Not on file    Minutes per session: Not on file  . Stress: Not on file  Relationships  . Social connections:    Talks on phone: Not on file    Gets together: Not on file    Attends religious service: Not on file    Active member of club or organization: Not on file    Attends meetings of clubs or organizations: Not on file    Relationship status: Not on file  . Intimate partner violence:    Fear of current or ex partner: Not on file    Emotionally abused: Not on file    Physically abused: Not on file    Forced sexual activity: Not on file  Other Topics Concern  . Not on file  Social History Narrative  . Not on file    Review of Systems: See HPI, otherwise negative ROS  Physical Exam: BP (!) 144/79   Pulse 67  Temp (!) 97.4 F (36.3 C)   Resp 18   Ht 5\' 2"  (1.575 m)   Wt 72.6 kg   SpO2 100%   BMI 29.26 kg/m  General:   Alert,  pleasant and cooperative in NAD Head:  Normocephalic and atraumatic. Neck:  Supple; no masses or thyromegaly. Lungs:  Clear throughout to auscultation, normal respiratory effort.    Heart:  +S1, +S2, Regular rate and rhythm, No edema. Abdomen:  Soft, nontender and nondistended. Normal bowel sounds, without guarding, and without rebound.   Neurologic:  Alert and  oriented x4;  grossly normal neurologically.  Impression/Plan: Theresa Yang is here for an colonoscopy to be performed for family history of colon polyps. Risks, benefits, limitations, and alternatives regarding  colonoscopy have been reviewed with the patient.  Questions have been answered.  All parties  agreeable.   Jonathon Bellows, MD  07/12/2018, 12:55 PM

## 2018-07-12 NOTE — Anesthesia Preprocedure Evaluation (Signed)
Anesthesia Evaluation  Patient identified by MRN, date of birth, ID band Patient awake    Reviewed: Allergy & Precautions, H&P , NPO status , Patient's Chart, lab work & pertinent test results  History of Anesthesia Complications (+) PONV and history of anesthetic complications  Airway Mallampati: III  TM Distance: <3 FB Neck ROM: full    Dental  (+) Chipped   Pulmonary neg pulmonary ROS, neg shortness of breath,           Cardiovascular Exercise Tolerance: Good hypertension, (-) angina(-) Past MI and (-) DOE      Neuro/Psych negative neurological ROS  negative psych ROS   GI/Hepatic negative GI ROS, Neg liver ROS, neg GERD  ,  Endo/Other  Hypothyroidism   Renal/GU negative Renal ROS  negative genitourinary   Musculoskeletal   Abdominal   Peds  Hematology negative hematology ROS (+)   Anesthesia Other Findings Past Medical History: No date: Complication of anesthesia No date: Hypertension No date: Hypothyroidism No date: PONV (postoperative nausea and vomiting) No date: Thyroid disease  Past Surgical History: No date: ABDOMINAL HYSTERECTOMY  BMI    Body Mass Index:  29.26 kg/m      Reproductive/Obstetrics negative OB ROS                             Anesthesia Physical Anesthesia Plan  ASA: III  Anesthesia Plan: General   Post-op Pain Management:    Induction: Intravenous  PONV Risk Score and Plan: Propofol infusion and TIVA  Airway Management Planned: Natural Airway and Nasal Cannula  Additional Equipment:   Intra-op Plan:   Post-operative Plan:   Informed Consent: I have reviewed the patients History and Physical, chart, labs and discussed the procedure including the risks, benefits and alternatives for the proposed anesthesia with the patient or authorized representative who has indicated his/her understanding and acceptance.     Dental Advisory  Given  Plan Discussed with: Anesthesiologist, CRNA and Surgeon  Anesthesia Plan Comments: (Patient consented for risks of anesthesia including but not limited to:  - adverse reactions to medications - risk of intubation if required - damage to teeth, lips or other oral mucosa - sore throat or hoarseness - Damage to heart, brain, lungs or loss of life  Patient voiced understanding.)        Anesthesia Quick Evaluation

## 2018-07-13 ENCOUNTER — Encounter: Payer: Self-pay | Admitting: Gastroenterology

## 2018-07-14 LAB — SURGICAL PATHOLOGY

## 2018-07-17 ENCOUNTER — Encounter: Payer: Self-pay | Admitting: Gastroenterology

## 2018-07-19 ENCOUNTER — Emergency Department
Admission: EM | Admit: 2018-07-19 | Discharge: 2018-07-19 | Disposition: A | Discharge status: Home / Self Care | Payer: BLUE CROSS/BLUE SHIELD | Attending: Emergency Medicine | Admitting: Emergency Medicine

## 2018-07-19 ENCOUNTER — Emergency Department: Payer: BLUE CROSS/BLUE SHIELD

## 2018-07-19 ENCOUNTER — Encounter: Payer: Self-pay | Admitting: Emergency Medicine

## 2018-07-19 ENCOUNTER — Other Ambulatory Visit: Payer: Self-pay

## 2018-07-19 DIAGNOSIS — Z79899 Other long term (current) drug therapy: Secondary | ICD-10-CM | POA: Diagnosis not present

## 2018-07-19 DIAGNOSIS — J069 Acute upper respiratory infection, unspecified: Secondary | ICD-10-CM

## 2018-07-19 DIAGNOSIS — E039 Hypothyroidism, unspecified: Secondary | ICD-10-CM | POA: Diagnosis not present

## 2018-07-19 DIAGNOSIS — R55 Syncope and collapse: Secondary | ICD-10-CM

## 2018-07-19 DIAGNOSIS — R05 Cough: Secondary | ICD-10-CM | POA: Diagnosis not present

## 2018-07-19 DIAGNOSIS — I10 Essential (primary) hypertension: Secondary | ICD-10-CM | POA: Insufficient documentation

## 2018-07-19 DIAGNOSIS — B9789 Other viral agents as the cause of diseases classified elsewhere: Secondary | ICD-10-CM

## 2018-07-19 LAB — URINALYSIS, COMPLETE (UACMP) WITH MICROSCOPIC
Bacteria, UA: NONE SEEN
Bilirubin Urine: NEGATIVE
Glucose, UA: NEGATIVE mg/dL
HGB URINE DIPSTICK: NEGATIVE
Ketones, ur: NEGATIVE mg/dL
Leukocytes,Ua: NEGATIVE
Nitrite: NEGATIVE
Protein, ur: NEGATIVE mg/dL
Specific Gravity, Urine: 1.013 (ref 1.005–1.030)
pH: 8 (ref 5.0–8.0)

## 2018-07-19 LAB — BASIC METABOLIC PANEL
ANION GAP: 7 (ref 5–15)
BUN: 13 mg/dL (ref 6–20)
CO2: 27 mmol/L (ref 22–32)
Calcium: 9.7 mg/dL (ref 8.9–10.3)
Chloride: 100 mmol/L (ref 98–111)
Creatinine, Ser: 0.91 mg/dL (ref 0.44–1.00)
GFR calc Af Amer: >60 mL/min (ref >60)
GFR calc non Af Amer: >60 mL/min (ref >60)
Glucose, Bld: 132 mg/dL — ABNORMAL HIGH (ref 70–99)
Potassium: 4.6 mmol/L (ref 3.5–5.1)
Sodium: 134 mmol/L — ABNORMAL LOW (ref 135–145)

## 2018-07-19 LAB — CBC
HCT: 41.8 % (ref 36.0–46.0)
Hemoglobin: 13.9 g/dL (ref 12.0–15.0)
MCH: 32.7 pg (ref 26.0–34.0)
MCHC: 33.3 g/dL (ref 30.0–36.0)
MCV: 98.4 fL (ref 80.0–100.0)
Platelets: 154 10*3/uL (ref 150–400)
RBC: 4.25 MIL/uL (ref 3.87–5.11)
RDW: 12 % (ref 11.5–15.5)
WBC: 8.6 10*3/uL (ref 4.0–10.5)
nRBC: 0 % (ref 0.0–0.2)

## 2018-07-19 LAB — GLUCOSE, CAPILLARY: Glucose-Capillary: 110 mg/dL — ABNORMAL HIGH (ref 70–99)

## 2018-07-19 LAB — TROPONIN I: Troponin I: <0.03 ng/mL (ref <0.03)

## 2018-07-19 NOTE — ED Provider Notes (Signed)
[facility] Emergency Department Provider Note  ____________________________________________  Time seen: Approximately 6:49 PM  I have reviewed the triage vital signs and the nursing notes.   HISTORY  Chief Complaint Loss of Consciousness   HPI VERNETTE Yang is a 53 y.o. female with a history of hypertension hypothyroidism who presents for evaluation of syncopal event.  Patient reports 2 days of cold-like symptoms with cough, congestion, sore throat, and chills.  She was sitting on the couch and started feeling dizzy like she was going to pass out.  Her sister was there with her and noticed the patient had a brief episode of loss of consciousness lasting several seconds while sitting on the couch.  No trauma sustained.  Patient denies headache, chest pain, shortness of breath, abdominal pain, back pain both preceding or after the syncopal event.  She reports that she really did not have anything much to eat or drink today. No fever, nausea, vomiting, or diarrhea.  Past Medical History:  Diagnosis Date  . Complication of anesthesia   . Hypertension   . Hypothyroidism   . PONV (postoperative nausea and vomiting)   . Thyroid disease     Patient Active Problem List   Diagnosis Date Noted  . Bladder prolapse, female, acquired 09/23/2017  . Cutaneous candidiasis 09/23/2017  . Dysuria 09/23/2017  . Vitamin D deficiency 09/23/2017  . Screening for colon cancer 09/01/2017  . Essential hypertension 07/24/2017  . Thyroid disease 07/24/2017  . Benign essential hypertension 03/27/2015  . Hyperlipidemia, mixed 03/27/2015  . Cervical stump prolapse 08/21/2014  . Vaginal yeast infection 08/21/2014  . Mitral valve regurgitation 04/24/2014    Past Surgical History:  Procedure Laterality Date  . ABDOMINAL HYSTERECTOMY    . COLONOSCOPY WITH PROPOFOL N/A 07/12/2018   Procedure: COLONOSCOPY WITH PROPOFOL;  Surgeon: Jonathon Bellows, MD;  Location: [facility] ENDOSCOPY;   Service: Gastroenterology;  Laterality: N/A;    Prior to Admission medications   Medication Sig Start Date End Date Taking? Authorizing Provider  clotrimazole-betamethasone (LOTRISONE) cream Apply 1 application topically 2 (two) times daily.    [provider]  desonide (DESOWEN) 0.05 % cream Apply topically.    [provider]  fluocinonide cream (LIDEX) 2.75 % Apply 1 application topically 2 (two) times daily.    [provider]  levothyroxine (SYNTHROID, LEVOTHROID) 50 MCG tablet Take 1 tablet (50 mcg total) by mouth daily before breakfast. 03/10/18   Ronnell Freshwater, NP  nystatin cream (MYCOSTATIN) Apply topically 3 (three) times daily. 09/01/17   Ronnell Freshwater, NP  telmisartan-hydrochlorothiazide (MICARDIS HCT) 80-12.5 MG tablet  07/11/17   [provider]    Allergies Patient has no known allergies.  Family History  Problem Relation Age of Onset  . Diabetes Mother   . Hypothyroidism Father   . Cancer Father   . Breast cancer Neg Hx     Social History Social History   Tobacco Use  . Smoking status: Never Smoker  . Smokeless tobacco: Never Used  Substance Use Topics  . Alcohol use: No    Frequency: Never  . Drug use: No    Review of Systems  Constitutional: Negative for fever. + chills, syncope Eyes: Negative for visual changes. ENT: + sore throat and congestion Neck: No neck pain  Cardiovascular: Negative for chest pain. Respiratory: Negative for shortness of breath. Gastrointestinal: Negative for abdominal pain, vomiting or diarrhea. Genitourinary: Negative for dysuria. Musculoskeletal: Negative for back pain. Skin: Negative for rash. Neurological: Negative for  headaches, weakness or numbness. Psych: No SI or HI  ____________________________________________   PHYSICAL EXAM:  VITAL SIGNS: ED Triage Vitals  Enc Vitals Group     BP 07/19/18 1423 127/62     Pulse Rate 07/19/18 1423 86     Resp 07/19/18 1423 16      Temp 07/19/18 1423 98.8 F (37.1 C)     Temp Source 07/19/18 1423 Oral     SpO2 07/19/18 1423 97 %     Weight 07/19/18 1424 160 lb (72.6 kg)     Height 07/19/18 1424 5' (1.524 m)     Head Circumference --      Peak Flow --      Pain Score 07/19/18 1423 0     Pain Loc --      Pain Edu? --      Excl. in [place]? --     Constitutional: Alert and oriented. Well appearing and in no apparent distress. HEENT:      Head: Normocephalic and atraumatic.         Eyes: Conjunctivae are normal. Sclera is non-icteric.       Mouth/Throat: Mucous membranes are moist.  Oropharynx is clear with no exudate, no erythema, no peritonsillar abscess.      Neck: Supple with no signs of meningismus. Cardiovascular: Regular rate and rhythm. No murmurs, gallops, or rubs. 2+ symmetrical distal pulses are present in all extremities. No JVD. Respiratory: Normal respiratory effort. Lungs are clear to auscultation bilaterally. No wheezes, crackles, or rhonchi.  Gastrointestinal: Soft, non tender, and non distended with positive bowel sounds. No rebound or guarding. Musculoskeletal: Nontender with normal range of motion in all extremities. No edema, cyanosis, or erythema of extremities. Neurologic: Normal speech and language. Face is symmetric. Moving all extremities. No gross focal neurologic deficits are appreciated. Skin: Skin is warm, dry and intact. No rash noted. Psychiatric: Mood and affect are normal. Speech and behavior are normal.  ____________________________________________   LABS (all labs ordered are listed, but only abnormal results are displayed)  Labs Reviewed  BASIC METABOLIC PANEL - Abnormal; Notable for the following components:      Result Value   Sodium 134 (*)    Glucose, Bld 132 (*)    All other components within normal limits  URINALYSIS, COMPLETE (UACMP) WITH MICROSCOPIC - Abnormal; Notable for the following components:   Color, Urine YELLOW (*)    APPearance CLEAR (*)    All other  components within normal limits  GLUCOSE, CAPILLARY - Abnormal; Notable for the following components:   Glucose-Capillary 110 (*)    All other components within normal limits  CBC  TROPONIN I  CBG MONITORING, ED   ____________________________________________  EKG  ED ECG REPORT I, Rudene Re, the attending physician, personally viewed and interpreted this ECG.  Normal sinus rhythm, normal intervals, normal axis, no STE or depressions, no evidence of HOCM, AV block, delta wave, ARVD, prolonged QTc, WPW, or Brug[place].   ____________________________________________  RADIOLOGY  I have personally reviewed the images performed during this visit and I agree with the Radiologist's read.   Interpretation by Radiologist:  Dg Chest 2 View  Result Date: 07/19/2018 CLINICAL DATA:  53 y/o  F; cough. EXAM: CHEST - 2 VIEW COMPARISON:  None. FINDINGS: The heart size and mediastinal contours are within normal limits. Both lungs are clear. The visualized skeletal structures are unremarkable. IMPRESSION: No active cardiopulmonary disease. Electronically Signed   By: Kristine Garbe M.D.   On: 07/19/2018 18:35  ____________________________________________   PROCEDURES  Procedure(s) performed: None Procedures Critical Care performed:  None ____________________________________________   INITIAL IMPRESSION / ASSESSMENT AND PLAN / ED COURSE  53 y.o. female with a history of hypertension hypothyroidism who presents for evaluation of syncopal event while sitting on the couch today in the setting of 2 days of cold-like symptoms and decreased oral intake today.  Patient looks extremely well-appearing, hemodynamically stable, oropharynx is clear, lungs are clear, abdomen is soft.  EKG with no evidence of ischemia or dysrhythmias.  Labs are no evidence of dehydration, AKI, electrolyte abnormalities or cardiac ischemia.  Chest x-ray negative for pneumonia.  Patient remained extremely  well-appearing with no further symptoms of syncope or near syncope in the emergency room.  Will discharge home on supportive care, increase oral hydration and close follow-up with primary care doctor.  Discussed and return precautions.      As part of my medical decision making, I reviewed the following data within the [facility] notes reviewed and incorporated, Labs reviewed , EKG interpreted , Old chart reviewed, Radiograph reviewed , Notes from prior ED visits and [place] Controlled Substance Database    Pertinent labs & imaging results that were available during my care of the patient were reviewed by me and considered in my medical decision making (see chart for details).    ____________________________________________   FINAL CLINICAL IMPRESSION(S) / ED DIAGNOSES  Final diagnoses:  Viral URI with cough  Syncope, unspecified syncope type      NEW MEDICATIONS STARTED DURING THIS VISIT:  ED Discharge Orders    None       Note:  This document was prepared using Dragon voice recognition software and may include unintentional dictation errors.    Rudene Re, MD 07/19/18 2221

## 2018-07-19 NOTE — ED Triage Notes (Signed)
Pt in via [facility] from home, reports feeling flushed and nauseated, then having a syncopal episode while sitting at the table.  Pt denies falling, denies hitting head.  Pt denies any hx of same.  Pt A/Ox4, vitals WDL, NAD noted at this time.

## 2018-07-19 NOTE — ED Notes (Signed)
First RN Note: Pt presents to ED via [facility], per EMS pt has had sore throat and cough x 2 day, had a "coughing fit" with mild syncopal episode lasting a few seconds, pt ambulatory to the ambulance. EMS reports dry, unproductive cough noted by EMS.  BP 114/77 HR 80 98.0 93% RA

## 2018-07-24 ENCOUNTER — Encounter: Payer: Self-pay | Admitting: Nurse Practitioner

## 2018-07-24 ENCOUNTER — Ambulatory Visit: Payer: BLUE CROSS/BLUE SHIELD | Admitting: Nurse Practitioner

## 2018-07-24 VITALS — BP 116/46 | HR 68 | Temp 96.2°F | Resp 16 | Ht 62.0 in | Wt 157.4 lb

## 2018-07-24 DIAGNOSIS — J069 Acute upper respiratory infection, unspecified: Secondary | ICD-10-CM | POA: Diagnosis not present

## 2018-07-24 DIAGNOSIS — R42 Dizziness and giddiness: Secondary | ICD-10-CM | POA: Diagnosis not present

## 2018-07-24 DIAGNOSIS — S93401A Sprain of unspecified ligament of right ankle, initial encounter: Secondary | ICD-10-CM

## 2018-07-24 NOTE — Progress Notes (Signed)
[facility] [place], [place] [address]  Internal MEDICINE  Office Visit Note  Patient Name: Theresa Yang  [id number]  [id number]  Date of Service: 07/24/2018  Chief Complaint  Patient presents with  . Follow-up    ER fu for viral uri and syncope    The patient is c/o sore throat, headache, cough, congestion, and dizziness. Started last week. Got so dizzy she nearly lost consciousness. She was seen in the ER. Labs were good. Was prescribed no new medication in the ER. Had similar episode of this on Saturday while in the shower. Caused her to twist her ankle. This is bruised and swollen. Able to move it and put pressure on the foot and walk without too much difficulty. She denies fever. Has missed several days of work due to this illness, and missed today as well.       Current Medication: Outpatient Encounter Medications as of 07/24/2018  Medication Sig  . clotrimazole-betamethasone (LOTRISONE) cream Apply 1 application topically 2 (two) times daily.  Marland Kitchen desonide (DESOWEN) 0.05 % cream Apply topically.  . fluocinonide cream (LIDEX) 9.47 % Apply 1 application topically 2 (two) times daily.  Marland Kitchen levothyroxine (SYNTHROID, LEVOTHROID) 50 MCG tablet Take 1 tablet (50 mcg total) by mouth daily before breakfast.  . nystatin cream (MYCOSTATIN) Apply topically 3 (three) times daily.  Marland Kitchen telmisartan-hydrochlorothiazide (MICARDIS HCT) 80-12.5 MG tablet    No facility-administered encounter medications on file as of 07/24/2018.     Surgical History: Past Surgical History:  Procedure Laterality Date  . ABDOMINAL HYSTERECTOMY    . COLONOSCOPY WITH PROPOFOL N/A 07/12/2018   Procedure: COLONOSCOPY WITH PROPOFOL;  Surgeon: Jonathon Bellows, MD;  Location: [facility] ENDOSCOPY;  Service: Gastroenterology;  Laterality: N/A;    Medical History: Past Medical History:  Diagnosis Date  . Complication of anesthesia   . Hypertension   . Hypothyroidism   . PONV (postoperative  nausea and vomiting)   . Thyroid disease     Family History: Family History  Problem Relation Age of Onset  . Diabetes Mother   . Hypothyroidism Father   . Cancer Father   . Breast cancer Neg Hx     Social History   Socioeconomic History  . Marital status: Single    Spouse name: Not on file  . Number of children: Not on file  . Years of education: Not on file  . Highest education level: Not on file  Occupational History  . Not on file  Social Needs  . Financial resource strain: Not on file  . Food insecurity:    Worry: Not on file    Inability: Not on file  . Transportation needs:    Medical: Not on file    Non-medical: Not on file  Tobacco Use  . Smoking status: Never Smoker  . Smokeless tobacco: Never Used  Substance and Sexual Activity  . Alcohol use: No    Frequency: Never  . Drug use: No  . Sexual activity: Not on file  Lifestyle  . Physical activity:    Days per week: Not on file    Minutes per session: Not on file  . Stress: Not on file  Relationships  . Social connections:    Talks on phone: Not on file    Gets together: Not on file    Attends religious service: Not on file    Active member of club or organization: Not on file    Attends meetings of clubs or organizations:  Not on file    Relationship status: Not on file  . Intimate partner violence:    Fear of current or ex partner: Not on file    Emotionally abused: Not on file    Physically abused: Not on file    Forced sexual activity: Not on file  Other Topics Concern  . Not on file  Social History Narrative  . Not on file      Review of Systems  Constitutional: Positive for activity change, appetite change, chills and fatigue. Negative for fever.  HENT: Positive for congestion, ear pain, postnasal drip, rhinorrhea, sinus pain and sore throat. Negative for voice change.   Respiratory: Positive for cough. Negative for wheezing.   Cardiovascular: Negative for chest pain and palpitations.        Low blood pressure today.   Gastrointestinal: Positive for nausea.  Endocrine: Negative for cold intolerance, heat intolerance, polydipsia and polyuria.  Musculoskeletal: Positive for arthralgias and myalgias.  Allergic/Immunologic: Negative.   Neurological: Positive for dizziness and headaches.  Hematological: Positive for adenopathy.  Psychiatric/Behavioral: Negative.     Today's Vitals   07/24/18 0938  BP: (!) 116/46  Pulse: 68  Resp: 16  Temp: (!) 96.2 F (35.7 C)  SpO2: 95%  Weight: 157 lb 6.4 oz (71.4 kg)  Height: 5\' 2"  (1.575 m)    Physical Exam Vitals signs and nursing note reviewed.  Constitutional:      General: She is not in acute distress.    Appearance: She is well-developed. She is ill-appearing. She is not diaphoretic.  HENT:     Head: Normocephalic and atraumatic.     Right Ear: Tympanic membrane is bulging.     Left Ear: Tympanic membrane is bulging.     Nose: Congestion and rhinorrhea present. Rhinorrhea is purulent.     Right Turbinates: Swollen.     Left Turbinates: Swollen.     Right Sinus: Maxillary sinus tenderness and frontal sinus tenderness present.     Left Sinus: Maxillary sinus tenderness and frontal sinus tenderness present.     Mouth/Throat:     Pharynx: No oropharyngeal exudate.  Eyes:     Pupils: Pupils are equal, round, and reactive to light.  Neck:     Musculoskeletal: Normal range of motion and neck supple.     Thyroid: No thyromegaly.     Vascular: No JVD.     Trachea: No tracheal deviation.  Cardiovascular:     Rate and Rhythm: Regular rhythm. Tachycardia present.     Heart sounds: Normal heart sounds. No murmur. No friction rub. No gallop.   Pulmonary:     Effort: Pulmonary effort is normal. No respiratory distress.     Breath sounds: Normal breath sounds. No wheezing or rales.  Chest:     Chest wall: No tenderness.  Abdominal:     General: Bowel sounds are normal.     Palpations: Abdomen is soft.  Musculoskeletal:  Normal range of motion.        General: Swelling and tenderness present.     Right lower leg: Edema present.     Comments: Mild swelling and bruising along the lateral aspect of the right ankle. ROM is intact. Tender with palpation. Able to apply weight to her foot and walking with slight limp favoring the right side.   Lymphadenopathy:     Cervical: Cervical adenopathy present.  Skin:    General: Skin is warm and dry.  Neurological:     Mental Status: She is  alert and oriented to person, place, and time.     Cranial Nerves: No cranial nerve deficit.  Psychiatric:        Behavior: Behavior normal.        Thought Content: Thought content normal.        Judgment: Judgment normal.   Assessment/Plan: 1. Acute upper respiratory infection Start z-pack. Take as directed for 5 days. Rest and increase fluids. Take OTC medication to improve acute symptoms. Work note given for days missed and will include Wednesday of this week. May return on next working day which is 07/28/2018  2. Dizziness Likely related to acute respiratory infection and mild dehydration. Encouraged rest and lots of fluids.   3. Sprain of right ankle, unspecified ligament, initial encounter Rest, ice, and elevate the ankle as much as possible. Recommend use of ace nadage to provide support when going back to work.   General Counseling: marcos peloso understanding of the findings of todays visit and agrees with plan of treatment. I have discussed any further diagnostic evaluation that may be needed or ordered today. We also reviewed her medications today. she has been encouraged to call the office with any questions or concerns that should arise related to todays visit.  Rest and increase fluids. Continue using OTC medication to control symptoms.   Apply a compressive ACE bandage. Rest and elevate the affected painful area.  Apply cold compresses intermittently as needed.  As pain recedes, begin normal activities slowly as  tolerated.  Call if symptoms persist.  This patient was seen by Leretha Pol FNP Collaboration with Dr Lavera Guise as a part of collaborative care agreement  Time spent: 25 Minutes      Dr Lavera Guise Internal medicine

## 2018-08-09 ENCOUNTER — Telehealth: Payer: Self-pay

## 2018-08-09 NOTE — Telephone Encounter (Signed)
Patient paperwork for [facility] has been completed and ready for pick up . Theresa Yang

## 2018-09-18 ENCOUNTER — Other Ambulatory Visit: Payer: Self-pay

## 2018-09-18 MED ORDER — LEVOTHYROXINE SODIUM 50 MCG PO TABS: 50.0000 ug | ORAL_TABLET | Freq: Every day | ORAL | 1 refills | Status: DC

## 2018-09-20 ENCOUNTER — Encounter: Payer: Self-pay | Admitting: Nurse Practitioner

## 2018-09-21 ENCOUNTER — Other Ambulatory Visit: Payer: Self-pay

## 2018-09-21 ENCOUNTER — Ambulatory Visit (INDEPENDENT_AMBULATORY_CARE_PROVIDER_SITE_OTHER): Payer: BLUE CROSS/BLUE SHIELD | Admitting: Nurse Practitioner

## 2018-09-21 ENCOUNTER — Encounter: Payer: Self-pay | Admitting: Nurse Practitioner

## 2018-09-21 VITALS — BP 137/82 | HR 62 | Resp 16 | Ht 62.0 in | Wt 157.0 lb

## 2018-09-21 DIAGNOSIS — E039 Hypothyroidism, unspecified: Secondary | ICD-10-CM

## 2018-09-21 DIAGNOSIS — Z0001 Encounter for general adult medical examination with abnormal findings: Secondary | ICD-10-CM

## 2018-09-21 DIAGNOSIS — I10 Essential (primary) hypertension: Secondary | ICD-10-CM

## 2018-09-21 NOTE — Progress Notes (Signed)
[facility] [place], [place] [address]  Internal MEDICINE  Telephone Visit  Patient Name: Theresa Yang  [id number]  [id number]  Date of Service: 10/11/2018  I connected with the patient at 4:28 by webcam and verified the patients identity using two identifiers.   I discussed the limitations, risks, security and privacy concerns of performing an evaluation and management service by webcam and the availability of in person appointments. I also discussed with the patient that there may be a patient responsible charge related to the service.  The patient expressed understanding and agrees to proceed.    Chief Complaint  Patient presents with  . Telephone Assessment  . Telephone Screen  . Annual Exam    The patient has been contacted via webcam for follow up visit due to concerns for spread of novel coronavirus. She has history of well controlled hypertension and hypothyroid. She has no current concerns or complaints. She is due to have routine, fasting labs done. Mammogram was last done 03/2018 and was benign after additional images were obtained.       Current Medication: Outpatient Encounter Medications as of 09/21/2018  Medication Sig  . clotrimazole-betamethasone (LOTRISONE) cream Apply 1 application topically 2 (two) times daily.  Marland Kitchen desonide (DESOWEN) 0.05 % cream Apply topically.  . fluocinonide cream (LIDEX) 5.10 % Apply 1 application topically 2 (two) times daily.  Marland Kitchen levothyroxine (SYNTHROID) 50 MCG tablet Take 1 tablet (50 mcg total) by mouth daily before breakfast.  . nystatin cream (MYCOSTATIN) Apply topically 3 (three) times daily.  Marland Kitchen telmisartan-hydrochlorothiazide (MICARDIS HCT) 80-12.5 MG tablet    No facility-administered encounter medications on file as of 09/21/2018.     Surgical History: Past Surgical History:  Procedure Laterality Date  . ABDOMINAL HYSTERECTOMY    . COLONOSCOPY WITH PROPOFOL N/A 07/12/2018   Procedure:  COLONOSCOPY WITH PROPOFOL;  Surgeon: Jonathon Bellows, MD;  Location: [facility] ENDOSCOPY;  Service: Gastroenterology;  Laterality: N/A;    Medical History: Past Medical History:  Diagnosis Date  . Complication of anesthesia   . Hypertension   . Hypothyroidism   . PONV (postoperative nausea and vomiting)   . Thyroid disease     Family History: Family History  Problem Relation Age of Onset  . Diabetes Mother   . Hypothyroidism Father   . Cancer Father   . Breast cancer Neg Hx     Social History   Socioeconomic History  . Marital status: Single    Spouse name: Not on file  . Number of children: Not on file  . Years of education: Not on file  . Highest education level: Not on file  Occupational History  . Not on file  Social Needs  . Financial resource strain: Not on file  . Food insecurity:    Worry: Not on file    Inability: Not on file  . Transportation needs:    Medical: Not on file    Non-medical: Not on file  Tobacco Use  . Smoking status: Never Smoker  . Smokeless tobacco: Never Used  Substance and Sexual Activity  . Alcohol use: No    Frequency: Never  . Drug use: No  . Sexual activity: Not on file  Lifestyle  . Physical activity:    Days per week: Not on file    Minutes per session: Not on file  . Stress: Not on file  Relationships  . Social connections:    Talks on phone: Not on file    Gets  together: Not on file    Attends religious service: Not on file    Active member of club or organization: Not on file    Attends meetings of clubs or organizations: Not on file    Relationship status: Not on file  . Intimate partner violence:    Fear of current or ex partner: Not on file    Emotionally abused: Not on file    Physically abused: Not on file    Forced sexual activity: Not on file  Other Topics Concern  . Not on file  Social History Narrative  . Not on file      Review of Systems  Constitutional: Negative for activity change, chills, fatigue,  fever and unexpected weight change.  HENT: Negative for congestion, postnasal drip, rhinorrhea, sneezing, sore throat and voice change.   Respiratory: Negative for cough, chest tightness, shortness of breath and wheezing.   Cardiovascular: Negative for chest pain, palpitations and leg swelling.  Gastrointestinal: Negative for abdominal pain, constipation, diarrhea, nausea and vomiting.  Endocrine: Negative for cold intolerance, heat intolerance, polydipsia and polyuria.       Recent thyroid panel was normal  Genitourinary: Negative for dysuria, frequency and urgency.  Musculoskeletal: Negative for arthralgias, back pain, joint swelling and neck pain.  Skin: Negative for rash.  Allergic/Immunologic: Positive for environmental allergies. Negative for immunocompromised state.  Neurological: Negative for dizziness, tremors, numbness and headaches.  Hematological: Negative for adenopathy. Does not bruise/bleed easily.  Psychiatric/Behavioral: Negative for behavioral problems (Depression), sleep disturbance and suicidal ideas. The patient is not nervous/anxious.     Today's Vitals   09/21/18 1509  BP: 137/82  Pulse: 62  Resp: 16  Weight: 157 lb (71.2 kg)  Height: 5\' 2"  (1.575 m)   Body mass index is 28.72 kg/m.  Observation/Objective:   The patient is alert and oriented. She is pleasant and answers all questions appropriately. Breathing is non-labored. She is in no acute distress at this time.    Assessment/Plan:  1. Encounter for general adult medical examination with abnormal findings Annual health maintenance exam today. Lab slip given to check routine, fasting labs .  2. Essential hypertension Well managed. Continue blood pressure medication as prescribed   3. Acquired hypothyroidism Check thyroid panel and adjust levothyroxine as indicated.    General Counseling: Theresa Yang understanding of the findings of today's phone visit and agrees with plan of treatment. I  have discussed any further diagnostic evaluation that may be needed or ordered today. We also reviewed her medications today. she has been encouraged to call the office with any questions or concerns that should arise related to todays visit.  Hypertension Counseling:   The following hypertensive lifestyle modification were recommended and discussed:  1. Limiting alcohol intake to less than 1 oz/day of ethanol:(24 oz of beer or 8 oz of wine or 2 oz of 100-proof whiskey). 2. Take baby ASA 81 mg daily. 3. Importance of regular aerobic exercise and losing weight. 4. Reduce dietary saturated fat and cholesterol intake for overall cardiovascular health. 5. Maintaining adequate dietary potassium, calcium, and magnesium intake. 6. Regular monitoring of the blood pressure. 7. Reduce sodium intake to less than 100 mmol/day (less than 2.3 gm of sodium or less than 6 gm of sodium choride)   This patient was seen by [place] with Dr Lavera Guise as a part of collaborative care agreement   Time spent: 24 Minutes    Dr Lavera Guise Internal medicine

## 2018-10-11 DIAGNOSIS — E039 Hypothyroidism, unspecified: Secondary | ICD-10-CM | POA: Insufficient documentation

## 2018-11-28 ENCOUNTER — Other Ambulatory Visit: Payer: Self-pay | Admitting: Nurse Practitioner

## 2018-11-29 LAB — LIPID PANEL W/O CHOL/HDL RATIO
Cholesterol, Total: 174 mg/dL (ref 100–199)
HDL: 48 mg/dL (ref >39)
LDL Calculated: 117 mg/dL — ABNORMAL HIGH (ref 0–99)
Triglycerides: 44 mg/dL (ref 0–149)
VLDL Cholesterol Cal: 9 mg/dL (ref 5–40)

## 2018-11-29 LAB — COMPREHENSIVE METABOLIC PANEL
ALT: 16 IU/L (ref 0–32)
AST: 17 IU/L (ref 0–40)
Albumin/Globulin Ratio: 1.8 (ref 1.2–2.2)
Albumin: 4.2 g/dL (ref 3.8–4.9)
Alkaline Phosphatase: 55 IU/L (ref 39–117)
BUN/Creatinine Ratio: 14 (ref 9–23)
BUN: 12 mg/dL (ref 6–24)
Bilirubin Total: 0.4 mg/dL (ref 0.0–1.2)
CO2: 24 mmol/L (ref 20–29)
Calcium: 9.5 mg/dL (ref 8.7–10.2)
Chloride: 101 mmol/L (ref 96–106)
Creatinine, Ser: 0.87 mg/dL (ref 0.57–1.00)
GFR calc Af Amer: 89 mL/min/{1.73_m2} (ref >59)
GFR calc non Af Amer: 77 mL/min/{1.73_m2} (ref >59)
Globulin, Total: 2.3 g/dL (ref 1.5–4.5)
Glucose: 96 mg/dL (ref 65–99)
Potassium: 4.8 mmol/L (ref 3.5–5.2)
Sodium: 135 mmol/L (ref 134–144)
Total Protein: 6.5 g/dL (ref 6.0–8.5)

## 2018-11-29 LAB — CBC
Hematocrit: 38.8 % (ref 34.0–46.6)
Hemoglobin: 13 g/dL (ref 11.1–15.9)
MCH: 32.1 pg (ref 26.6–33.0)
MCHC: 33.5 g/dL (ref 31.5–35.7)
MCV: 96 fL (ref 79–97)
Platelets: 203 10*3/uL (ref 150–450)
RBC: 4.05 x10E6/uL (ref 3.77–5.28)
RDW: 11.7 % (ref 11.7–15.4)
WBC: 4.5 10*3/uL (ref 3.4–10.8)

## 2018-11-29 LAB — T3: T3, Total: 98 ng/dL (ref 71–180)

## 2018-11-29 LAB — T4, FREE: Free T4: 1.19 ng/dL (ref 0.82–1.77)

## 2018-11-29 LAB — TSH: TSH: 3.58 u[IU]/mL (ref 0.450–4.500)

## 2018-11-29 LAB — VITAMIN D 25 HYDROXY (VIT D DEFICIENCY, FRACTURES): Vit D, 25-Hydroxy: 25.5 ng/mL — ABNORMAL LOW (ref 30.0–100.0)

## 2019-01-04 ENCOUNTER — Ambulatory Visit: Payer: BLUE CROSS/BLUE SHIELD | Admitting: Nurse Practitioner

## 2019-01-11 ENCOUNTER — Ambulatory Visit: Payer: BC Managed Care – PPO | Admitting: Nurse Practitioner

## 2019-01-11 ENCOUNTER — Other Ambulatory Visit: Payer: Self-pay

## 2019-01-11 ENCOUNTER — Encounter: Payer: Self-pay | Admitting: Nurse Practitioner

## 2019-01-11 VITALS — BP 122/70 | HR 61 | Temp 96.6°F | Resp 16 | Ht 62.0 in | Wt 152.4 lb

## 2019-01-11 DIAGNOSIS — E039 Hypothyroidism, unspecified: Secondary | ICD-10-CM | POA: Diagnosis not present

## 2019-01-11 DIAGNOSIS — R928 Other abnormal and inconclusive findings on diagnostic imaging of breast: Secondary | ICD-10-CM

## 2019-01-11 DIAGNOSIS — I10 Essential (primary) hypertension: Secondary | ICD-10-CM | POA: Diagnosis not present

## 2019-01-11 NOTE — Progress Notes (Signed)
[facility] [place], [place] [address]  Internal MEDICINE  Office Visit Note  Patient Name: Theresa Yang  [id number]  [id number]  Date of Service: 01/11/2019  Chief Complaint  Patient presents with  . Follow-up  . Hypertension  . Hypothyroidism    The patient is here for routine follow up visit. Her blood pressure is a little low today. She does see cardiology for blood pressure management and cardiac issues. She is due to schedule and appointment with them. She had abnormal finding in left breast at her screening mammogram in 03/2018. Six month diagnostic mammogram was suggested for further evaluation. This will be ordered for her today.       Current Medication: Outpatient Encounter Medications as of 01/11/2019  Medication Sig  . desonide (DESOWEN) 0.05 % cream Apply topically.  . fluocinonide cream (LIDEX) 1.01 % Apply 1 application topically 2 (two) times daily.  Marland Kitchen levothyroxine (SYNTHROID) 50 MCG tablet Take 1 tablet (50 mcg total) by mouth daily before breakfast.  . nystatin cream (MYCOSTATIN) Apply topically 3 (three) times daily.  Marland Kitchen telmisartan-hydrochlorothiazide (MICARDIS HCT) 80-12.5 MG tablet 1 tablet daily.   . [DISCONTINUED] clotrimazole-betamethasone (LOTRISONE) cream Apply 1 application topically 2 (two) times daily.   No facility-administered encounter medications on file as of 01/11/2019.     Surgical History: Past Surgical History:  Procedure Laterality Date  . ABDOMINAL HYSTERECTOMY    . COLONOSCOPY WITH PROPOFOL N/A 07/12/2018   Procedure: COLONOSCOPY WITH PROPOFOL;  Surgeon: Jonathon Bellows, MD;  Location: [facility] ENDOSCOPY;  Service: Gastroenterology;  Laterality: N/A;    Medical History: Past Medical History:  Diagnosis Date  . Complication of anesthesia   . Hypertension   . Hypothyroidism   . PONV (postoperative nausea and vomiting)   . Thyroid disease     Family History: Family History  Problem Relation Age of  Onset  . Diabetes Mother   . Hypothyroidism Father   . Cancer Father   . Breast cancer Neg Hx     Social History   Socioeconomic History  . Marital status: Single    Spouse name: Not on file  . Number of children: Not on file  . Years of education: Not on file  . Highest education level: Not on file  Occupational History  . Not on file  Social Needs  . Financial resource strain: Not on file  . Food insecurity    Worry: Not on file    Inability: Not on file  . Transportation needs    Medical: Not on file    Non-medical: Not on file  Tobacco Use  . Smoking status: Never Smoker  . Smokeless tobacco: Never Used  Substance and Sexual Activity  . Alcohol use: No    Frequency: Never  . Drug use: No  . Sexual activity: Not on file  Lifestyle  . Physical activity    Days per week: Not on file    Minutes per session: Not on file  . Stress: Not on file  Relationships  . Social Herbalist on phone: Not on file    Gets together: Not on file    Attends religious service: Not on file    Active member of club or organization: Not on file    Attends meetings of clubs or organizations: Not on file    Relationship status: Not on file  . Intimate partner violence    Fear of current or ex partner: Not on file  Emotionally abused: Not on file    Physically abused: Not on file    Forced sexual activity: Not on file  Other Topics Concern  . Not on file  Social History Narrative  . Not on file      Review of Systems  Constitutional: Negative for activity change, chills, fatigue, fever and unexpected weight change.  HENT: Negative for congestion, postnasal drip, rhinorrhea, sneezing, sore throat and voice change.   Respiratory: Negative for cough, chest tightness, shortness of breath and wheezing.   Cardiovascular: Negative for chest pain, palpitations and leg swelling.  Gastrointestinal: Negative for abdominal pain, constipation, diarrhea, nausea and vomiting.   Endocrine: Negative for cold intolerance, heat intolerance, polydipsia and polyuria.       Recent thyroid panel was normal  Musculoskeletal: Negative for arthralgias, back pain, joint swelling and neck pain.  Skin: Negative for rash.  Allergic/Immunologic: Positive for environmental allergies. Negative for immunocompromised state.  Neurological: Negative for dizziness, tremors, numbness and headaches.  Hematological: Negative for adenopathy. Does not bruise/bleed easily.  Psychiatric/Behavioral: Negative for behavioral problems (Depression), sleep disturbance and suicidal ideas. The patient is not nervous/anxious.     Today's Vitals   01/11/19 1049  BP: 122/70  Pulse: 61  Resp: 16  Temp: (!) 96.6 F (35.9 C)  SpO2: 97%  Weight: 152 lb 6.4 oz (69.1 kg)  Height: 5\' 2"  (1.575 m)   Body mass index is 27.87 kg/m.  Physical Exam Vitals signs and nursing note reviewed.  Constitutional:      General: She is not in acute distress.    Appearance: Normal appearance. She is well-developed. She is not diaphoretic.  HENT:     Head: Normocephalic and atraumatic.     Mouth/Throat:     Pharynx: No oropharyngeal exudate.  Eyes:     Pupils: Pupils are equal, round, and reactive to light.  Neck:     Musculoskeletal: Normal range of motion and neck supple.     Thyroid: No thyromegaly.     Vascular: No carotid bruit or JVD.     Trachea: No tracheal deviation.  Cardiovascular:     Rate and Rhythm: Normal rate and regular rhythm.     Heart sounds: Normal heart sounds. No murmur. No friction rub. No gallop.   Pulmonary:     Effort: Pulmonary effort is normal. No respiratory distress.     Breath sounds: Normal breath sounds. No wheezing or rales.  Chest:     Chest wall: No tenderness.  Abdominal:     Palpations: Abdomen is soft.  Musculoskeletal: Normal range of motion.  Lymphadenopathy:     Cervical: No cervical adenopathy.  Skin:    General: Skin is warm and dry.  Neurological:      Mental Status: She is alert and oriented to person, place, and time.     Cranial Nerves: No cranial nerve deficit.  Psychiatric:        Behavior: Behavior normal.        Thought Content: Thought content normal.        Judgment: Judgment normal.    Assessment/Plan: 1. Acquired hypothyroidism Stable thyroid. Continue levothyroxine as prescribed   2. Essential hypertension Stable. Continue meds as prescribed. Follow up with cardiology as scheduled.   3. Abnormal mammogram of left breast Diagnostic mammogram of left breast ordered today. Follow up as indicated.  - MM Digital Diagnostic Unilat L; Future  General Counseling: Ysabela verbalizes understanding of the findings of todays visit and agrees with plan of  treatment. I have discussed any further diagnostic evaluation that may be needed or ordered today. We also reviewed her medications today. she has been encouraged to call the office with any questions or concerns that should arise related to todays visit.   This patient was seen by Leretha Pol FNP Collaboration with Dr Lavera Guise as a part of collaborative care agreement  Orders Placed This Encounter  Procedures  . MM Digital Diagnostic Unilat L     Time spent: 83 Minutes      Dr Lavera Guise Internal medicine

## 2019-01-16 ENCOUNTER — Other Ambulatory Visit: Payer: Self-pay | Admitting: Nurse Practitioner

## 2019-01-16 DIAGNOSIS — N6489 Other specified disorders of breast: Secondary | ICD-10-CM

## 2019-01-16 DIAGNOSIS — R928 Other abnormal and inconclusive findings on diagnostic imaging of breast: Secondary | ICD-10-CM

## 2019-02-08 ENCOUNTER — Ambulatory Visit
Admission: RE | Admit: 2019-02-08 | Discharge: 2019-02-08 | Disposition: A | Discharge status: Home / Self Care | Payer: BC Managed Care – PPO | Source: Ambulatory Visit | Attending: Nurse Practitioner | Admitting: Nurse Practitioner

## 2019-02-08 DIAGNOSIS — R928 Other abnormal and inconclusive findings on diagnostic imaging of breast: Secondary | ICD-10-CM | POA: Diagnosis not present

## 2019-02-08 DIAGNOSIS — N6489 Other specified disorders of breast: Secondary | ICD-10-CM

## 2019-02-08 NOTE — Progress Notes (Signed)
More frequent monitoring recommended.

## 2019-03-19 ENCOUNTER — Other Ambulatory Visit: Payer: Self-pay | Admitting: Nurse Practitioner

## 2019-03-19 MED ORDER — LEVOTHYROXINE SODIUM 50 MCG PO TABS: 50.0000 ug | ORAL_TABLET | Freq: Every day | ORAL | 1 refills | Status: DC

## 2019-07-17 ENCOUNTER — Telehealth: Payer: Self-pay

## 2019-07-17 NOTE — Telephone Encounter (Signed)
CONFIRMED 07-19-19 OV AS VIRTUAL.

## 2019-07-19 ENCOUNTER — Ambulatory Visit (INDEPENDENT_AMBULATORY_CARE_PROVIDER_SITE_OTHER): Payer: BC Managed Care – PPO | Admitting: Nurse Practitioner

## 2019-07-19 ENCOUNTER — Other Ambulatory Visit: Payer: Self-pay

## 2019-07-19 ENCOUNTER — Encounter: Payer: Self-pay | Admitting: Nurse Practitioner

## 2019-07-19 VITALS — BP 130/73 | HR 72 | Ht 62.0 in | Wt 152.0 lb

## 2019-07-19 DIAGNOSIS — E039 Hypothyroidism, unspecified: Secondary | ICD-10-CM

## 2019-07-19 DIAGNOSIS — I10 Essential (primary) hypertension: Secondary | ICD-10-CM

## 2019-07-19 DIAGNOSIS — B372 Candidiasis of skin and nail: Secondary | ICD-10-CM | POA: Diagnosis not present

## 2019-07-19 MED ORDER — NYSTATIN 100000 UNIT/GM EX CREA: TOPICAL_CREAM | Freq: Three times a day (TID) | CUTANEOUS | 3 refills | Status: DC

## 2019-07-19 NOTE — Progress Notes (Signed)
[facility] [place], [place] [address]  Internal MEDICINE  Telephone Visit  Patient Name: Theresa Yang  [id number]  [id number]  Date of Service: 07/19/2019  I connected with the patient at 9:31am by webcam and verified the patients identity using two identifiers.   I discussed the limitations, risks, security and privacy concerns of performing an evaluation and management service by webcam and the availability of in person appointments. I also discussed with the patient that there may be a patient responsible charge related to the service.  The patient expressed understanding and agrees to proceed.    Chief Complaint  Patient presents with  . Telephone Screen  . Telephone Assessment  . Hypertension  . Hypothyroidism    The patient has been contacted via webcam for follow up visit due to concerns for spread of novel coronavirus. She presents for routine visit. Blood pressure is well controlled. She has no concerns or complaints today. Would like to have refill for her nystatin cream. She does use this intermittently for vaginal candidal infection which comes and goes. She does get complete relief from the rash while using nystatin cream.       Current Medication: Outpatient Encounter Medications as of 07/19/2019  Medication Sig  . desonide (DESOWEN) 0.05 % cream Apply topically.  . fluocinonide cream (LIDEX) [id number] % Apply 1 application topically 2 (two) times daily.  Marland Kitchen levothyroxine (SYNTHROID) 50 MCG tablet Take 1 tablet (50 mcg total) by mouth daily before breakfast.  . nystatin cream (MYCOSTATIN) Apply topically 3 (three) times daily.  Marland Kitchen telmisartan-hydrochlorothiazide (MICARDIS HCT) 80-12.5 MG tablet 1 tablet daily.   . [DISCONTINUED] nystatin cream (MYCOSTATIN) Apply topically 3 (three) times daily.   No facility-administered encounter medications on file as of 07/19/2019.    Surgical History: Past Surgical History:  Procedure Laterality  Date  . ABDOMINAL HYSTERECTOMY    . COLONOSCOPY WITH PROPOFOL N/A 07/12/2018   Procedure: COLONOSCOPY WITH PROPOFOL;  Surgeon: Jonathon Bellows, MD;  Location: [facility] ENDOSCOPY;  Service: Gastroenterology;  Laterality: N/A;    Medical History: Past Medical History:  Diagnosis Date  . Complication of anesthesia   . Hypertension   . Hypothyroidism   . PONV (postoperative nausea and vomiting)   . Thyroid disease     Family History: Family History  Problem Relation Age of Onset  . Diabetes Mother   . Hypothyroidism Father   . Cancer Father   . Breast cancer Neg Hx     Social History   Socioeconomic History  . Marital status: Single    Spouse name: Not on file  . Number of children: Not on file  . Years of education: Not on file  . Highest education level: Not on file  Occupational History  . Not on file  Tobacco Use  . Smoking status: Never Smoker  . Smokeless tobacco: Never Used  Substance and Sexual Activity  . Alcohol use: No  . Drug use: No  . Sexual activity: Not on file  Other Topics Concern  . Not on file  Social History Narrative  . Not on file   Social Determinants of Health   Financial Resource Strain:   . Difficulty of Paying Living Expenses: Not on file  Food Insecurity:   . Worried About Charity fundraiser in the Last Year: Not on file  . Ran Out of Food in the Last Year: Not on file  Transportation Needs:   . Lack of Transportation (Medical): Not on file  .  Lack of Transportation (Non-Medical): Not on file  Physical Activity:   . Days of Exercise per Week: Not on file  . Minutes of Exercise per Session: Not on file  Stress:   . Feeling of Stress : Not on file  Social Connections:   . Frequency of Communication with Friends and Family: Not on file  . Frequency of Social Gatherings with Friends and Family: Not on file  . Attends Religious Services: Not on file  . Active Member of Clubs or Organizations: Not on file  . Attends Archivist  Meetings: Not on file  . Marital Status: Not on file  Intimate Partner Violence:   . Fear of Current or Ex-Partner: Not on file  . Emotionally Abused: Not on file  . Physically Abused: Not on file  . Sexually Abused: Not on file      Review of Systems  Constitutional: Negative for activity change, chills, fatigue, fever and unexpected weight change.  HENT: Negative for congestion, postnasal drip, rhinorrhea, sneezing, sore throat and voice change.   Respiratory: Negative for cough, chest tightness, shortness of breath and wheezing.   Cardiovascular: Negative for chest pain and palpitations.       Blood pressure is well managed.   Gastrointestinal: Negative for abdominal pain, constipation, diarrhea, nausea and vomiting.  Endocrine: Negative for cold intolerance, heat intolerance, polydipsia and polyuria.  Musculoskeletal: Negative for arthralgias, back pain, joint swelling and neck pain.  Skin: Negative for rash.  Allergic/Immunologic: Positive for environmental allergies. Negative for immunocompromised state.  Neurological: Negative for dizziness, tremors, numbness and headaches.  Hematological: Negative for adenopathy. Does not bruise/bleed easily.  Psychiatric/Behavioral: Negative for behavioral problems (Depression), sleep disturbance and suicidal ideas. The patient is not nervous/anxious.     Today's Vitals   07/19/19 0917  BP: 130/73  Pulse: 72  Weight: 152 lb (68.9 kg)  Height: 5\' 2"  (1.575 m)   Body mass index is 27.8 kg/m.  Observation/Objective:   The patient is alert and oriented. She is pleasant and answers all questions appropriately. Breathing is non-labored. She is in no acute distress at this time.    Assessment/Plan: 1. Essential hypertension Stable. Continue BP medication as prescribed   2. Acquired hypothyroidism Continue levothyroxine as prescribed   3. Cutaneous candidiasis May conitnue to use nystatin cream as needed and as prescribed  -  nystatin cream (MYCOSTATIN); Apply topically 3 (three) times daily.  Dispense: 30 g; Refill: 3  General Counseling: Roneisha verbalizes understanding of the findings of today's phone visit and agrees with plan of treatment. I have discussed any further diagnostic evaluation that may be needed or ordered today. We also reviewed her medications today. she has been encouraged to call the office with any questions or concerns that should arise related to todays visit.   This patient was seen by [place] with Dr Lavera Guise as a part of collaborative care agreement  Meds ordered this encounter  Medications  . nystatin cream (MYCOSTATIN)    Sig: Apply topically 3 (three) times daily.    Dispense:  30 g    Refill:  3    Order Specific Question:   Supervising Provider    Answer:   Lavera Guise [id number]    Time spent: 23 Minutes    Dr Lavera Guise Internal medicine

## 2019-09-20 ENCOUNTER — Other Ambulatory Visit: Payer: Self-pay | Admitting: Nurse Practitioner

## 2019-09-20 DIAGNOSIS — N6489 Other specified disorders of breast: Secondary | ICD-10-CM

## 2019-09-20 DIAGNOSIS — Z1231 Encounter for screening mammogram for malignant neoplasm of breast: Secondary | ICD-10-CM

## 2019-09-27 ENCOUNTER — Other Ambulatory Visit: Payer: Self-pay

## 2019-09-27 MED ORDER — LEVOTHYROXINE SODIUM 50 MCG PO TABS: 50.0000 ug | ORAL_TABLET | Freq: Every day | ORAL | 1 refills | Status: DC

## 2019-10-10 ENCOUNTER — Ambulatory Visit
Admission: RE | Admit: 2019-10-10 | Discharge: 2019-10-10 | Disposition: A | Discharge status: Home / Self Care | Payer: BC Managed Care – PPO | Source: Ambulatory Visit | Attending: Nurse Practitioner | Admitting: Nurse Practitioner

## 2019-10-10 DIAGNOSIS — N6489 Other specified disorders of breast: Secondary | ICD-10-CM

## 2019-10-10 DIAGNOSIS — Z1231 Encounter for screening mammogram for malignant neoplasm of breast: Secondary | ICD-10-CM | POA: Diagnosis present

## 2019-10-10 LAB — HM MAMMOGRAPHY: HM Mammogram: NORMAL (ref 0–4)

## 2019-10-11 NOTE — Progress Notes (Signed)
Stable. Left breast asymmetry.

## 2019-11-15 IMAGING — MG MM DIGITAL DIAGNOSTIC UNILAT*L* W/ TOMO W/ CAD
4 series · 4 of 12 positions shown · non-contrast
Comparison: Previous exam(s).

CLINICAL DATA: Patient presents for six-month follow-up of a
probably benign left breast mass without sonographic correlate.

EXAM:
DIGITAL DIAGNOSTIC UNILATERAL LEFT MAMMOGRAM WITH CAD AND TOMO

[L MLO synth-2D]
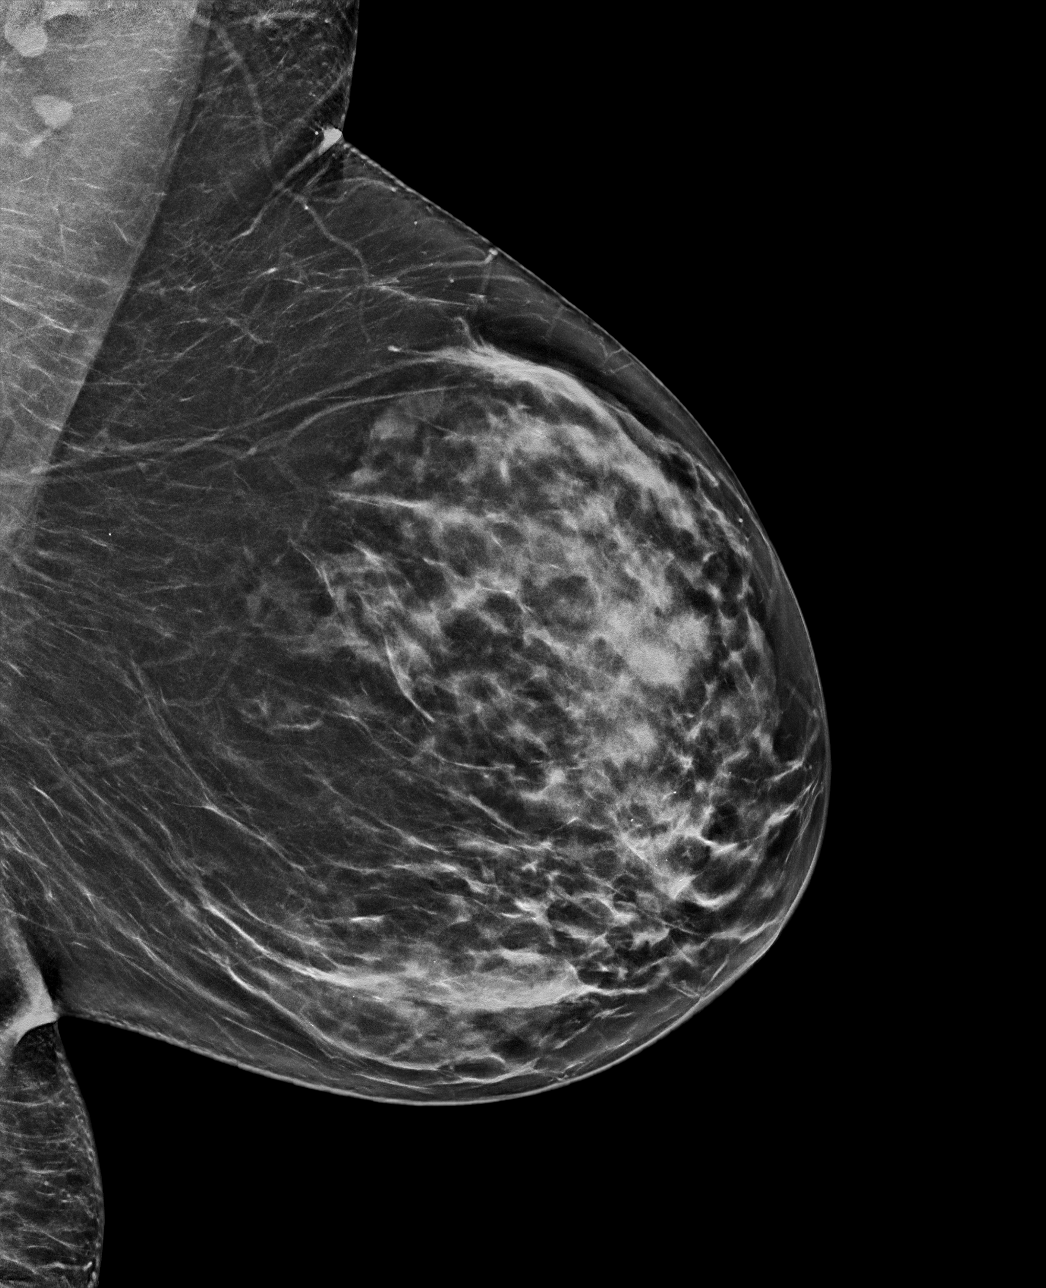

[L CC synth-2D]
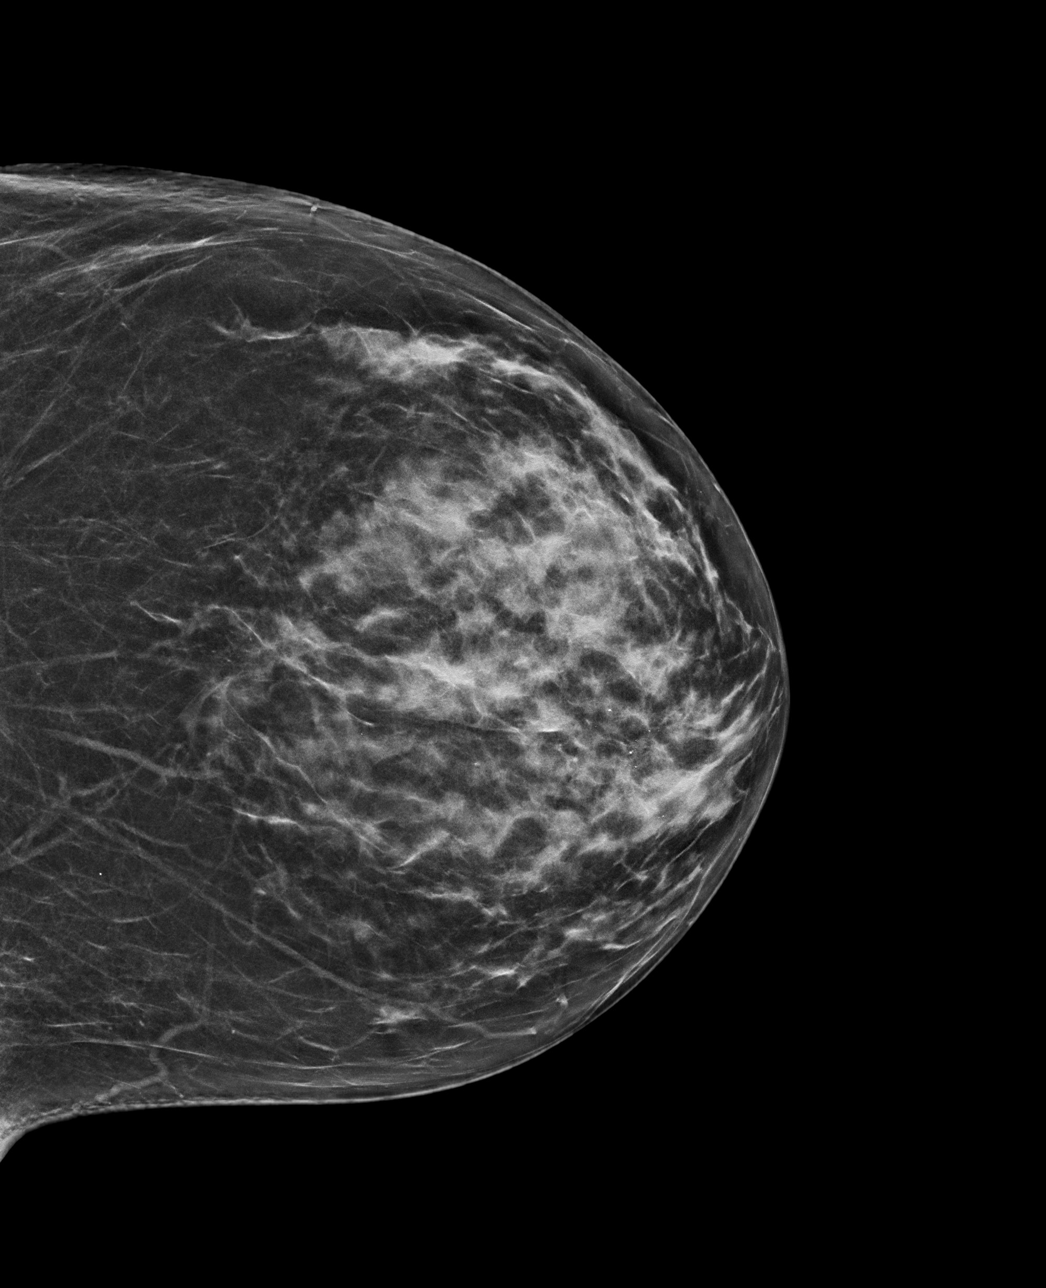

[L MLO tomo · tomo slice 33/65.0]
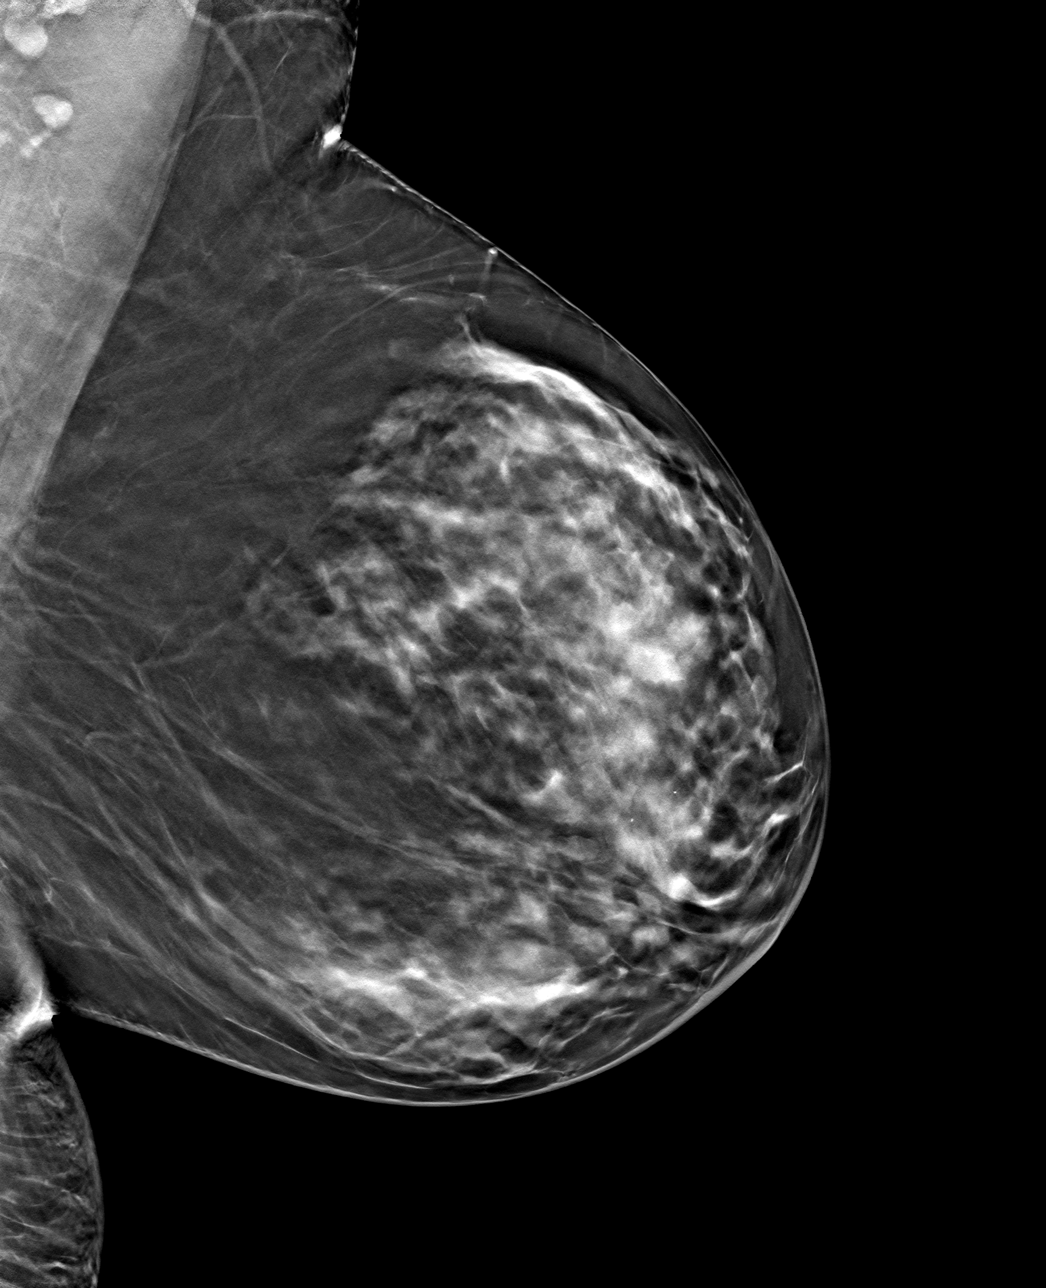

[L CC tomo · tomo slice 31/61.0]
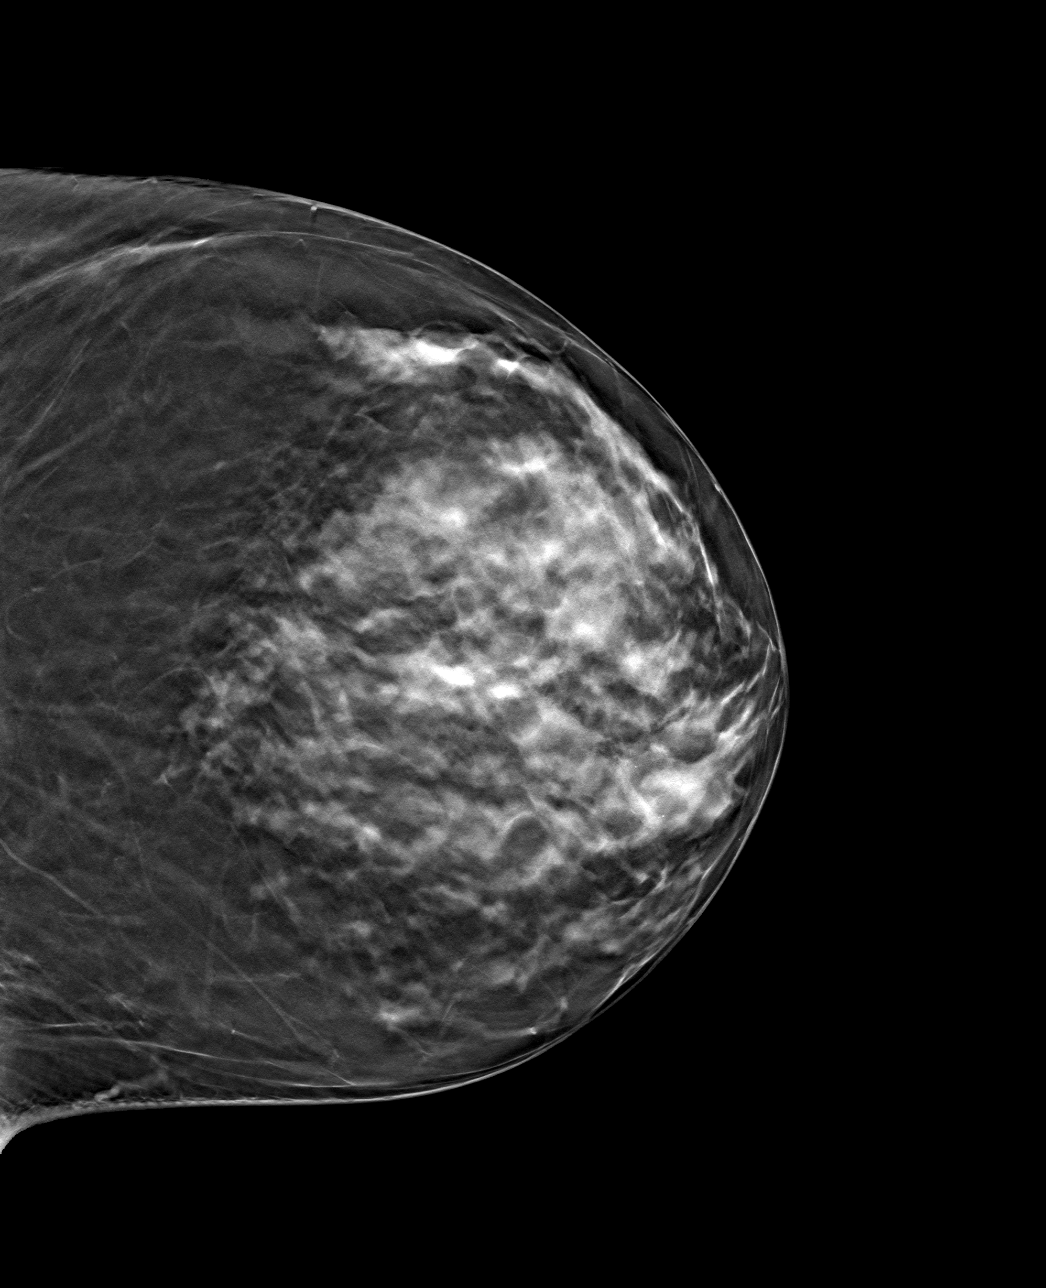

[4 of 12 positions shown; findings below may reference images not displayed]

ACR Breast Density Category c: The breast tissue is heterogeneously
dense, which may obscure small masses.
FINDINGS: Full field CC and MLO views of the left breast demonstrate decreased
conspicuity of the previously seen asymmetry in the 6 o'clock
position of the left breast. No new suspicious finding identified in
the left breast.

Mammographic images were processed with CAD.
IMPRESSION: Interval decreased conspicuity of a left breast asymmetry favored to
represent benign parenchymal tissue.

RECOMMENDATION:
Diagnostic bilateral mammogram is recommended in 6 months to
document 1 year stability of the questioned asymmetry in the left
breast.

I have discussed the findings and recommendations with the patient.
If applicable, a reminder letter will be sent to the patient
regarding the next appointment.

BI-RADS CATEGORY  3: Probably benign.

## 2019-12-15 ENCOUNTER — Ambulatory Visit: Payer: BC Managed Care – PPO | Attending: Internal Medicine

## 2019-12-15 ENCOUNTER — Other Ambulatory Visit: Payer: Self-pay

## 2019-12-15 DIAGNOSIS — Z23 Encounter for immunization: Secondary | ICD-10-CM

## 2019-12-15 NOTE — Progress Notes (Signed)
   Covid-19 Vaccination Clinic  Name:  Theresa Yang    MRN: [id number] DOB: December 01, 1965  12/15/2019  [name] was observed post Covid-19 immunization for 15 minutes without incident. She was provided with Vaccine Information Sheet and instruction to access the V-Safe system.   [name] was instructed to call 911 with any severe reactions post vaccine: Marland Kitchen Difficulty breathing  . Swelling of face and throat  . A fast heartbeat  . A bad rash all over body  . Dizziness and weakness   Immunizations Administered    Name Date Dose VIS Date Route   Pfizer COVID-19 Vaccine 12/15/2019  9:44 AM 0.3 mL 07/25/2018 Intramuscular   Manufacturer: [place]   Lot: [id number]   [place]: [address]-2

## 2020-01-07 ENCOUNTER — Ambulatory Visit: Payer: BC Managed Care – PPO

## 2020-01-15 ENCOUNTER — Telehealth: Payer: Self-pay

## 2020-01-15 NOTE — Telephone Encounter (Signed)
Lmom to confirm and screen for 01-17-20 ov.

## 2020-01-17 ENCOUNTER — Ambulatory Visit (INDEPENDENT_AMBULATORY_CARE_PROVIDER_SITE_OTHER): Payer: BC Managed Care – PPO | Admitting: Nurse Practitioner

## 2020-01-17 ENCOUNTER — Other Ambulatory Visit: Payer: Self-pay

## 2020-01-17 ENCOUNTER — Encounter: Payer: Self-pay | Admitting: Nurse Practitioner

## 2020-01-17 VITALS — BP 124/64 | HR 64 | Temp 97.7°F | Resp 16 | Ht 62.0 in | Wt 152.0 lb

## 2020-01-17 DIAGNOSIS — B372 Candidiasis of skin and nail: Secondary | ICD-10-CM

## 2020-01-17 DIAGNOSIS — Z0001 Encounter for general adult medical examination with abnormal findings: Secondary | ICD-10-CM

## 2020-01-17 DIAGNOSIS — E039 Hypothyroidism, unspecified: Secondary | ICD-10-CM | POA: Diagnosis not present

## 2020-01-17 DIAGNOSIS — I10 Essential (primary) hypertension: Secondary | ICD-10-CM

## 2020-01-17 DIAGNOSIS — R300 Dysuria: Secondary | ICD-10-CM

## 2020-01-17 MED ORDER — CLOTRIMAZOLE-BETAMETHASONE 1-0.05 % EX CREA: 1.0000 "application " | TOPICAL_CREAM | Freq: Two times a day (BID) | CUTANEOUS | 2 refills | Status: DC

## 2020-01-17 NOTE — Progress Notes (Signed)
[facility] [place],  [address]  Internal MEDICINE  Office Visit Note  Patient Name: Theresa Yang  [id number]  [id number]  Date of Service: 01/30/2020   Pt is here for routine health maintenance examination   Chief Complaint  Patient presents with  . Annual Exam  . Hypertension  . Quality Metric Gaps    TDAP, HIVscreening, HepC     The patient is here for health maintenance exam. Today, she has complaint ofrash uner both breasts. She first noted this a few weeks ago. It started off very itchy. She did use some OTC Gold bond powder which helped some. She did get her second COVID 19 vaccine on /13/2021. She is due to have check of routine, fasting labs. She had diagnostic  mammogram 09/2019 which was showing probably benig findings. A repeat screening mammogram was recommended for one year.    Current Medication: Outpatient Encounter Medications as of 01/17/2020  Medication Sig  . desonide (DESOWEN) 0.05 % cream Apply topically.  . fluocinonide cream (LIDEX) 5.28 % Apply 1 application topically 2 (two) times daily.  Marland Kitchen levothyroxine (SYNTHROID) 50 MCG tablet Take 1 tablet (50 mcg total) by mouth daily before breakfast.  . nystatin cream (MYCOSTATIN) Apply topically 3 (three) times daily.  Marland Kitchen telmisartan-hydrochlorothiazide (MICARDIS HCT) 80-12.5 MG tablet 1 tablet daily.   . clotrimazole-betamethasone (LOTRISONE) cream Apply 1 application topically 2 (two) times daily.   No facility-administered encounter medications on file as of 01/17/2020.    Surgical History: Past Surgical History:  Procedure Laterality Date  . ABDOMINAL HYSTERECTOMY    . COLONOSCOPY WITH PROPOFOL N/A 07/12/2018   Procedure: COLONOSCOPY WITH PROPOFOL;  Surgeon: Jonathon Bellows, MD;  Location: [facility] ENDOSCOPY;  Service: Gastroenterology;  Laterality: N/A;    Medical History: Past Medical History:  Diagnosis Date  . Complication of anesthesia   . Hypertension   .  Hypothyroidism   . PONV (postoperative nausea and vomiting)   . Thyroid disease     Family History: Family History  Problem Relation Age of Onset  . Diabetes Mother   . Hypothyroidism Father   . Cancer Father   . Breast cancer Neg Hx       Review of Systems  Constitutional: Negative for activity change, chills, fatigue, fever and unexpected weight change.  HENT: Negative for congestion, postnasal drip, rhinorrhea, sneezing, sore throat and voice change.   Respiratory: Negative for cough, chest tightness, shortness of breath and wheezing.   Cardiovascular: Negative for chest pain and palpitations.       Blood pressure is well managed.   Gastrointestinal: Negative for abdominal pain, constipation, diarrhea, nausea and vomiting.  Endocrine: Negative for cold intolerance, heat intolerance, polydipsia and polyuria.  Genitourinary: Negative for dysuria and urgency.  Musculoskeletal: Negative for arthralgias, back pain, joint swelling and neck pain.  Skin: Positive for rash.       Itchy, tender rash underneath bilateral breasts.   Allergic/Immunologic: Positive for environmental allergies. Negative for immunocompromised state.  Neurological: Negative for dizziness, tremors, numbness and headaches.  Hematological: Negative for adenopathy. Does not bruise/bleed easily.  Psychiatric/Behavioral: Negative for behavioral problems (Depression), sleep disturbance and suicidal ideas. The patient is not nervous/anxious.      Today's Vitals   01/17/20 0915  BP: 124/64  Pulse: 64  Resp: 16  Temp: 97.7 F (36.5 C)  SpO2: 99%  Height: 5\' 2"  (1.575 m)   Body mass index is 27.8 kg/m.  Physical Exam Vitals and nursing note reviewed.  Constitutional:      General: She is not in acute distress.    Appearance: Normal appearance. She is well-developed. She is obese. She is not diaphoretic.  HENT:     Head: Normocephalic and atraumatic.     Nose: Nose normal.     Mouth/Throat:      Pharynx: No oropharyngeal exudate.  Eyes:     Pupils: Pupils are equal, round, and reactive to light.  Neck:     Thyroid: No thyromegaly.     Vascular: No carotid bruit or JVD.     Trachea: No tracheal deviation.  Cardiovascular:     Rate and Rhythm: Normal rate and regular rhythm.     Pulses: Normal pulses.     Heart sounds: Normal heart sounds. No murmur heard.  No friction rub. No gallop.   Pulmonary:     Effort: Pulmonary effort is normal. No respiratory distress.     Breath sounds: Normal breath sounds. No wheezing or rales.  Chest:     Chest wall: No tenderness.     Breasts:        Right: Normal. No bleeding, inverted nipple, mass, nipple discharge, skin change or tenderness.        Left: Normal. No bleeding, inverted nipple, mass, nipple discharge, skin change or tenderness.  Abdominal:     General: Bowel sounds are normal.     Palpations: Abdomen is soft.     Tenderness: There is no abdominal tenderness.  Musculoskeletal:        General: Normal range of motion.     Cervical back: Normal range of motion and neck supple.  Lymphadenopathy:     Cervical: No cervical adenopathy.     Upper Body:     Right upper body: No axillary adenopathy.     Left upper body: No axillary adenopathy.  Skin:    General: Skin is warm and dry.     Comments: There is erythematous, irritated appearing rash under bilateral breasts. Skin intact with no evidence of drainage present.   Neurological:     Mental Status: She is alert and oriented to person, place, and time.     Cranial Nerves: No cranial nerve deficit.  Psychiatric:        Mood and Affect: Mood normal.        Behavior: Behavior normal.        Thought Content: Thought content normal.        Judgment: Judgment normal.    Assessment/Plan: 1. Encounter for general adult medical examination with abnormal findings Annual helath maintenance exam today. Order slip given to have routine, fasting labs drawn.   2. Essential  hypertension Stable. Continue bp medication as prescribed   3. Cutaneous candidiasis May apply lotrisone cream to effected areas up to twice daily. Should use for two to three days after rash resolves to ensure resolution.  - clotrimazole-betamethasone (LOTRISONE) cream; Apply 1 application topically 2 (two) times daily.  Dispense: 45 g; Refill: 2  4. Acquired hypothyroidism Check thyroid panel and adjust levothyroxine dose as indicated.   5. Dysuria - UA/M w/rflx Culture, Routine  General Counseling: Areliz verbalizes understanding of the findings of todays visit and agrees with plan of treatment. I have discussed any further diagnostic evaluation that may be needed or ordered today. We also reviewed her medications today. she has been encouraged to call the office with any questions or concerns that should arise related to todays visit.    Counseling:  This patient was  seen by Leretha Pol FNP Collaboration with Dr Lavera Guise as a part of collaborative care agreement  Orders Placed This Encounter  Procedures  . Microscopic Examination  . Urine Culture, Reflex  . UA/M w/rflx Culture, Routine    Meds ordered this encounter  Medications  . clotrimazole-betamethasone (LOTRISONE) cream    Sig: Apply 1 application topically 2 (two) times daily.    Dispense:  45 g    Refill:  2    Order Specific Question:   Supervising Provider    Answer:   Lavera Guise [[id number]    Total time spent: 74 Minutes  Time spent includes review of chart, medications, test results, and follow up plan with the patient.     Lavera Guise, MD  Internal Medicine

## 2020-01-18 NOTE — Progress Notes (Signed)
Waiting on urine culture results.

## 2020-01-19 LAB — UA/M W/RFLX CULTURE, ROUTINE
Bilirubin, UA: NEGATIVE
Glucose, UA: NEGATIVE
Ketones, UA: NEGATIVE
Nitrite, UA: NEGATIVE
Protein,UA: NEGATIVE
RBC, UA: NEGATIVE
Specific Gravity, UA: 1.011 (ref 1.005–1.030)
Urobilinogen, Ur: 0.2 mg/dL (ref 0.2–1.0)
pH, UA: 6.5 (ref 5.0–7.5)

## 2020-01-19 LAB — MICROSCOPIC EXAMINATION
Casts: NONE SEEN /lpf
Epithelial Cells (non renal): >10 /hpf — AB (ref 0–10)
RBC, Urine: NONE SEEN /hpf (ref 0–2)

## 2020-01-19 LAB — URINE CULTURE, REFLEX

## 2020-02-27 ENCOUNTER — Other Ambulatory Visit: Payer: Self-pay | Admitting: Nurse Practitioner

## 2020-02-28 LAB — COMPREHENSIVE METABOLIC PANEL
ALT: 23 IU/L (ref 0–32)
AST: 21 IU/L (ref 0–40)
Albumin/Globulin Ratio: 2 (ref 1.2–2.2)
Albumin: 4.6 g/dL (ref 3.8–4.9)
Alkaline Phosphatase: 66 IU/L (ref 44–121)
BUN/Creatinine Ratio: 15 (ref 9–23)
BUN: 14 mg/dL (ref 6–24)
Bilirubin Total: 0.5 mg/dL (ref 0.0–1.2)
CO2: 26 mmol/L (ref 20–29)
Calcium: 10.2 mg/dL (ref 8.7–10.2)
Chloride: 101 mmol/L (ref 96–106)
Creatinine, Ser: 0.96 mg/dL (ref 0.57–1.00)
GFR calc Af Amer: 79 mL/min/{1.73_m2} (ref >59)
GFR calc non Af Amer: 68 mL/min/{1.73_m2} (ref >59)
Globulin, Total: 2.3 g/dL (ref 1.5–4.5)
Glucose: 98 mg/dL (ref 65–99)
Potassium: 4.6 mmol/L (ref 3.5–5.2)
Sodium: 140 mmol/L (ref 134–144)
Total Protein: 6.9 g/dL (ref 6.0–8.5)

## 2020-02-28 LAB — CBC
Hematocrit: 39.6 % (ref 34.0–46.6)
Hemoglobin: 13.7 g/dL (ref 11.1–15.9)
MCH: 33.2 pg — ABNORMAL HIGH (ref 26.6–33.0)
MCHC: 34.6 g/dL (ref 31.5–35.7)
MCV: 96 fL (ref 79–97)
Platelets: 189 10*3/uL (ref 150–450)
RBC: 4.13 x10E6/uL (ref 3.77–5.28)
RDW: 11.8 % (ref 11.7–15.4)
WBC: 5 10*3/uL (ref 3.4–10.8)

## 2020-02-28 LAB — LIPID PANEL WITH LDL/HDL RATIO
Cholesterol, Total: 173 mg/dL (ref 100–199)
HDL: 51 mg/dL (ref >39)
LDL Chol Calc (NIH): 114 mg/dL — ABNORMAL HIGH (ref 0–99)
LDL/HDL Ratio: 2.2 ratio (ref 0.0–3.2)
Triglycerides: 40 mg/dL (ref 0–149)
VLDL Cholesterol Cal: 8 mg/dL (ref 5–40)

## 2020-02-28 LAB — T4, FREE: Free T4: 1.29 ng/dL (ref 0.82–1.77)

## 2020-02-28 LAB — VITAMIN D 25 HYDROXY (VIT D DEFICIENCY, FRACTURES): Vit D, 25-Hydroxy: 33.6 ng/mL (ref 30.0–100.0)

## 2020-02-28 LAB — TSH: TSH: 3.67 u[IU]/mL (ref 0.450–4.500)

## 2020-03-04 NOTE — Progress Notes (Signed)
Labs good

## 2020-04-01 ENCOUNTER — Other Ambulatory Visit: Payer: Self-pay

## 2020-04-01 MED ORDER — LEVOTHYROXINE SODIUM 50 MCG PO TABS: 50.0000 ug | ORAL_TABLET | Freq: Every day | ORAL | 1 refills | Status: DC

## 2020-07-22 ENCOUNTER — Ambulatory Visit: Payer: BC Managed Care – PPO | Admitting: Hospice and Palliative Medicine

## 2020-07-22 ENCOUNTER — Encounter: Payer: Self-pay | Admitting: Hospice and Palliative Medicine

## 2020-07-22 ENCOUNTER — Other Ambulatory Visit: Payer: Self-pay

## 2020-07-22 VITALS — BP 124/80 | HR 66 | Temp 97.8°F | Resp 16 | Ht 62.0 in | Wt 160.0 lb

## 2020-07-22 DIAGNOSIS — I7300 Raynaud's syndrome without gangrene: Secondary | ICD-10-CM | POA: Diagnosis not present

## 2020-07-22 DIAGNOSIS — H6123 Impacted cerumen, bilateral: Secondary | ICD-10-CM | POA: Diagnosis not present

## 2020-07-22 DIAGNOSIS — I10 Essential (primary) hypertension: Secondary | ICD-10-CM

## 2020-07-22 MED ORDER — AMLODIPINE BESYLATE 2.5 MG PO TABS: 2.5000 mg | ORAL_TABLET | Freq: Every day | ORAL | 0 refills | Status: DC

## 2020-07-22 NOTE — Progress Notes (Signed)
[facility] [place],  [address]  Internal MEDICINE  Office Visit Note  Patient Name: Theresa Yang  [id number]  [id number]  Date of Service: 07/23/2020  Chief Complaint  Patient presents with  . Hypertension  . Hypothyroidism  . Follow-up  . Ear Problem    left    HPI Patient is here for routine follow-up C/o left ear discomfort and itching, ear feels full, has been going on for a few weeks Over the last few days both ears have started to itch Reviewed recent labs--LDL slightly elevated Noticed bilateral hands to be bright red--explains this happens when her hands come into contact with cold weather or even when she is holding a cold beverage, also complains that fingers become numb and tingling when exposed to cold temperatures No history of smoking BP remains well controlled Sleeping well without difficulty, no recent changes in appetite or bowel habits  Mammogram up to date Colonoscopy to be updated 2025   Current Medication: Outpatient Encounter Medications as of 07/22/2020  Medication Sig  . amLODipine (NORVASC) 2.5 MG tablet Take 1 tablet (2.5 mg total) by mouth daily.  . clotrimazole-betamethasone (LOTRISONE) cream Apply 1 application topically 2 (two) times daily.  Marland Kitchen desonide (DESOWEN) 0.05 % cream Apply topically.  . fluocinonide cream (LIDEX) 7.74 % Apply 1 application topically 2 (two) times daily.  Marland Kitchen levothyroxine (SYNTHROID) 50 MCG tablet Take 1 tablet (50 mcg total) by mouth daily before breakfast.  . nystatin cream (MYCOSTATIN) Apply topically 3 (three) times daily.  Marland Kitchen telmisartan-hydrochlorothiazide (MICARDIS HCT) 80-12.5 MG tablet 1 tablet daily.    No facility-administered encounter medications on file as of 07/22/2020.    Surgical History: Past Surgical History:  Procedure Laterality Date  . ABDOMINAL HYSTERECTOMY    . COLONOSCOPY WITH PROPOFOL N/A 07/12/2018   Procedure: COLONOSCOPY WITH PROPOFOL;  Surgeon:  Jonathon Bellows, MD;  Location: [facility] ENDOSCOPY;  Service: Gastroenterology;  Laterality: N/A;    Medical History: Past Medical History:  Diagnosis Date  . Complication of anesthesia   . Hypertension   . Hypothyroidism   . PONV (postoperative nausea and vomiting)   . Thyroid disease     Family History: Family History  Problem Relation Age of Onset  . Diabetes Mother   . Hypothyroidism Father   . Cancer Father   . Breast cancer Neg Hx     Social History   Socioeconomic History  . Marital status: Single    Spouse name: Not on file  . Number of children: Not on file  . Years of education: Not on file  . Highest education level: Not on file  Occupational History  . Not on file  Tobacco Use  . Smoking status: Never Smoker  . Smokeless tobacco: Never Used  Vaping Use  . Vaping Use: Never used  Substance and Sexual Activity  . Alcohol use: No  . Drug use: No  . Sexual activity: Not on file  Other Topics Concern  . Not on file  Social History Narrative  . Not on file   Social Determinants of Health   Financial Resource Strain: Not on file  Food Insecurity: Not on file  Transportation Needs: Not on file  Physical Activity: Not on file  Stress: Not on file  Social Connections: Not on file  Intimate Partner Violence: Not on file      Review of Systems  Constitutional: Negative for chills, diaphoresis and fatigue.  HENT: Negative for ear pain, postnasal drip and  sinus pressure.   Eyes: Negative for photophobia, discharge, redness, itching and visual disturbance.  Respiratory: Negative for cough, shortness of breath and wheezing.   Cardiovascular: Negative for chest pain, palpitations and leg swelling.  Gastrointestinal: Negative for abdominal pain, constipation, diarrhea, nausea and vomiting.  Endocrine: Positive for cold intolerance.  Genitourinary: Negative for dysuria and flank pain.  Musculoskeletal: Negative for arthralgias, back pain, gait problem and neck  pain.  Skin: Negative for color change.  Allergic/Immunologic: Negative for environmental allergies and food allergies.  Neurological: Negative for dizziness and headaches.  Hematological: Does not bruise/bleed easily.  Psychiatric/Behavioral: Negative for agitation, behavioral problems (depression) and hallucinations.    Vital Signs: BP 124/80   Pulse 66   Temp 97.8 F (36.6 C)   Resp 16   Ht 5\' 2"  (1.575 m)   Wt 160 lb (72.6 kg)   SpO2 96%   BMI 29.26 kg/m    Physical Exam Vitals reviewed.  Constitutional:      Appearance: Normal appearance. She is normal weight.  HENT:     Right Ear: There is impacted cerumen.     Left Ear: There is impacted cerumen.  Cardiovascular:     Rate and Rhythm: Normal rate and regular rhythm.     Pulses: Normal pulses.     Heart sounds: Normal heart sounds.  Pulmonary:     Effort: Pulmonary effort is normal.     Breath sounds: Normal breath sounds.  Abdominal:     General: Abdomen is flat.  Musculoskeletal:        General: Normal range of motion.     Cervical back: Normal range of motion.  Skin:    Findings: Erythema present.     Comments: Erythema to bilateral hands  Neurological:     General: No focal deficit present.     Mental Status: She is alert and oriented to person, place, and time. Mental status is at baseline.  Psychiatric:        Mood and Affect: Mood normal.        Behavior: Behavior normal.        Thought Content: Thought content normal.        Judgment: Judgment normal.    Assessment/Plan: 1. Bilateral impacted cerumen Lavage performed to bilateral ears with successful removal of cerumen Advised to buy OTC Debrox drops and apply few times per week to avoid further impaction - Ear Lavage  2. Raynaud's phenomenon without gangrene Start low dose CCB for symptom management Will closely monitor BP - amLODipine (NORVASC) 2.5 MG tablet; Take 1 tablet (2.5 mg total) by mouth daily.  Dispense: 90 tablet; Refill: 0  3.  Essential hypertension BP and HR well controlled May need to make adjustments to therapy since starting low dose amlodipine for Raynaud's phenomenon   General Counseling: [name] understanding of the findings of todays visit and agrees with plan of treatment. I have discussed any further diagnostic evaluation that may be needed or ordered today. We also reviewed her medications today. she has been encouraged to call the office with any questions or concerns that should arise related to todays visit.    Orders Placed This Encounter  Procedures  . Ear Lavage    Meds ordered this encounter  Medications  . amLODipine (NORVASC) 2.5 MG tablet    Sig: Take 1 tablet (2.5 mg total) by mouth daily.    Dispense:  90 tablet    Refill:  0    Time spent: 30 Minutes Time  spent includes review of chart, medications, test results and follow-up plan with the patient.  This patient was seen by Theodoro Grist AGNP-C in Collaboration with Dr Lavera Guise as a part of collaborative care agreement     Tanna Furry. Emelina Hinch AGNP-C Internal medicine

## 2020-07-23 ENCOUNTER — Encounter: Payer: Self-pay | Admitting: Hospice and Palliative Medicine

## 2020-08-20 ENCOUNTER — Ambulatory Visit: Payer: BC Managed Care – PPO | Admitting: Hospice and Palliative Medicine

## 2020-08-21 ENCOUNTER — Encounter: Payer: Self-pay | Admitting: Hospice and Palliative Medicine

## 2020-08-21 ENCOUNTER — Ambulatory Visit (INDEPENDENT_AMBULATORY_CARE_PROVIDER_SITE_OTHER): Payer: BC Managed Care – PPO | Admitting: Hospice and Palliative Medicine

## 2020-08-21 ENCOUNTER — Other Ambulatory Visit: Payer: Self-pay

## 2020-08-21 VITALS — BP 130/72 | HR 70 | Temp 97.6°F | Resp 16 | Ht 62.0 in | Wt 161.4 lb

## 2020-08-21 DIAGNOSIS — I7300 Raynaud's syndrome without gangrene: Secondary | ICD-10-CM

## 2020-08-21 DIAGNOSIS — Z1231 Encounter for screening mammogram for malignant neoplasm of breast: Secondary | ICD-10-CM | POA: Diagnosis not present

## 2020-08-21 DIAGNOSIS — I10 Essential (primary) hypertension: Secondary | ICD-10-CM | POA: Diagnosis not present

## 2020-08-21 NOTE — Progress Notes (Signed)
[facility] [place], [place] [address]  Internal MEDICINE  Office Visit Note  Patient Name: Theresa Yang  [id number]  [id number]  Date of Service: 08/22/2020  Chief Complaint  Patient presents with  . Follow-up    Discuss meds  . Hypertension  . Quality Metric Gaps    Pap scheduled    HPI Patient is here for routine follow-up Follow-up from last visit after being started on low dose CCB for Raynaud phenomenon  She has not started medication--she became nervous after reading the insert from pharmacy, concerned that it would make her BP too low She is not interested in starting medication for her hands, had dealt with symptoms for many years, does not affect her day to day life, denies pain or numbness Local pharmacies have been out of debrox drops but plans to get some once arrived, ears continue to feel better today--removal or cerumen impaction at last visit  Current Medication: Outpatient Encounter Medications as of 08/21/2020  Medication Sig  . clotrimazole-betamethasone (LOTRISONE) cream Apply 1 application topically 2 (two) times daily.  Marland Kitchen desonide (DESOWEN) 0.05 % cream Apply topically.  . fluocinonide cream (LIDEX) 8.65 % Apply 1 application topically 2 (two) times daily.  Marland Kitchen levothyroxine (SYNTHROID) 50 MCG tablet Take 1 tablet (50 mcg total) by mouth daily before breakfast.  . nystatin cream (MYCOSTATIN) Apply topically 3 (three) times daily.  Marland Kitchen telmisartan-hydrochlorothiazide (MICARDIS HCT) 80-12.5 MG tablet 1 tablet daily.   . [DISCONTINUED] amLODipine (NORVASC) 2.5 MG tablet Take 1 tablet (2.5 mg total) by mouth daily. (Patient not taking: Reported on 08/21/2020)   No facility-administered encounter medications on file as of 08/21/2020.    Surgical History: Past Surgical History:  Procedure Laterality Date  . ABDOMINAL HYSTERECTOMY    . COLONOSCOPY WITH PROPOFOL N/A 07/12/2018   Procedure: COLONOSCOPY WITH PROPOFOL;  Surgeon: Jonathon Bellows, MD;  Location: [facility] ENDOSCOPY;  Service: Gastroenterology;  Laterality: N/A;    Medical History: Past Medical History:  Diagnosis Date  . Complication of anesthesia   . Hypertension   . Hypothyroidism   . PONV (postoperative nausea and vomiting)   . Thyroid disease     Family History: Family History  Problem Relation Age of Onset  . Diabetes Mother   . Hypothyroidism Father   . Cancer Father   . Breast cancer Neg Hx     Social History   Socioeconomic History  . Marital status: Single    Spouse name: Not on file  . Number of children: Not on file  . Years of education: Not on file  . Highest education level: Not on file  Occupational History  . Not on file  Tobacco Use  . Smoking status: Never Smoker  . Smokeless tobacco: Never Used  Vaping Use  . Vaping Use: Never used  Substance and Sexual Activity  . Alcohol use: No  . Drug use: No  . Sexual activity: Not on file  Other Topics Concern  . Not on file  Social History Narrative  . Not on file   Social Determinants of Health   Financial Resource Strain: Not on file  Food Insecurity: Not on file  Transportation Needs: Not on file  Physical Activity: Not on file  Stress: Not on file  Social Connections: Not on file  Intimate Partner Violence: Not on file      Review of Systems  Constitutional: Negative for chills, diaphoresis and fatigue.  HENT: Negative for ear pain, postnasal drip and  sinus pressure.   Eyes: Negative for photophobia, discharge, redness, itching and visual disturbance.  Respiratory: Negative for cough, shortness of breath and wheezing.   Cardiovascular: Negative for chest pain, palpitations and leg swelling.  Gastrointestinal: Negative for abdominal pain, constipation, diarrhea, nausea and vomiting.  Genitourinary: Negative for dysuria and flank pain.  Musculoskeletal: Negative for arthralgias, back pain, gait problem and neck pain.  Skin: Negative for color change.   Allergic/Immunologic: Negative for environmental allergies and food allergies.  Neurological: Negative for dizziness and headaches.  Hematological: Does not bruise/bleed easily.  Psychiatric/Behavioral: Negative for agitation, behavioral problems (depression) and hallucinations.    Vital Signs: BP 130/72   Pulse 70   Temp 97.6 F (36.4 C)   Resp 16   Ht 5\' 2"  (1.575 m)   Wt 161 lb 6.4 oz (73.2 kg)   SpO2 98%   BMI 29.52 kg/m    Physical Exam Vitals reviewed.  Constitutional:      Appearance: Normal appearance. She is normal weight.  Cardiovascular:     Rate and Rhythm: Normal rate and regular rhythm.     Pulses: Normal pulses.     Heart sounds: Normal heart sounds.  Pulmonary:     Effort: Pulmonary effort is normal.     Breath sounds: Normal breath sounds.  Abdominal:     General: Abdomen is flat.     Palpations: Abdomen is soft.  Musculoskeletal:        General: Normal range of motion.     Cervical back: Normal range of motion.  Skin:    General: Skin is warm.  Neurological:     General: No focal deficit present.     Mental Status: She is alert and oriented to person, place, and time. Mental status is at baseline.  Psychiatric:        Mood and Affect: Mood normal.        Behavior: Behavior normal.        Thought Content: Thought content normal.        Judgment: Judgment normal.    Assessment/Plan: 1. Essential hypertension BP and HR well controlled on current therapy, continue to monitor  2. Raynaud's phenomenon without gangrene Hold off on initiating therapy at this time, symptoms remain self-limiting  3. Encounter for screening mammogram for malignant neoplasm of breast - MM Digital Screening; Future  General Counseling: raquel racey understanding of the findings of todays visit and agrees with plan of treatment. I have discussed any further diagnostic evaluation that may be needed or ordered today. We also reviewed her medications today. she has  been encouraged to call the office with any questions or concerns that should arise related to todays visit.    Orders Placed This Encounter  Procedures  . MM Digital Screening   Time spent: 30 Minutes Time spent includes review of chart, medications, test results and follow-up plan with the patient.  This patient was seen by Theodoro Grist AGNP-C in Collaboration with Dr Lavera Guise as a part of collaborative care agreement     Tanna Furry. Harris AGNP-C Internal medicine

## 2020-08-22 ENCOUNTER — Encounter: Payer: Self-pay | Admitting: Hospice and Palliative Medicine

## 2020-09-29 ENCOUNTER — Other Ambulatory Visit: Payer: Self-pay | Admitting: Nurse Practitioner

## 2020-10-01 ENCOUNTER — Other Ambulatory Visit: Payer: Self-pay | Admitting: Nurse Practitioner

## 2020-10-02 ENCOUNTER — Other Ambulatory Visit: Payer: Self-pay

## 2020-10-02 ENCOUNTER — Other Ambulatory Visit: Payer: Self-pay | Admitting: Nurse Practitioner

## 2020-10-02 MED ORDER — LEVOTHYROXINE SODIUM 50 MCG PO TABS: 50.0000 ug | ORAL_TABLET | Freq: Every day | ORAL | 1 refills | Status: DC

## 2020-12-03 ENCOUNTER — Other Ambulatory Visit: Payer: Self-pay | Admitting: Hospice and Palliative Medicine

## 2020-12-03 DIAGNOSIS — Z1231 Encounter for screening mammogram for malignant neoplasm of breast: Secondary | ICD-10-CM

## 2020-12-19 ENCOUNTER — Telehealth: Payer: Self-pay

## 2020-12-19 NOTE — Telephone Encounter (Signed)
Refill request from [facility] for Norvasc 2.5 mg, called pt to ask if taking bc not on pt's med list.  Pt informed me that she is no longer taking norvasc

## 2021-01-01 ENCOUNTER — Other Ambulatory Visit: Payer: Self-pay | Admitting: Internal Medicine

## 2021-01-02 ENCOUNTER — Other Ambulatory Visit: Payer: Self-pay

## 2021-01-22 ENCOUNTER — Ambulatory Visit (INDEPENDENT_AMBULATORY_CARE_PROVIDER_SITE_OTHER): Payer: BC Managed Care – PPO | Admitting: Nurse Practitioner

## 2021-01-22 ENCOUNTER — Other Ambulatory Visit: Payer: Self-pay

## 2021-01-22 ENCOUNTER — Encounter: Payer: Self-pay | Admitting: Nurse Practitioner

## 2021-01-22 VITALS — BP 123/62 | HR 62 | Temp 97.5°F | Resp 16 | Ht 62.0 in | Wt 161.8 lb

## 2021-01-22 DIAGNOSIS — Z0001 Encounter for general adult medical examination with abnormal findings: Secondary | ICD-10-CM | POA: Diagnosis not present

## 2021-01-22 DIAGNOSIS — I7300 Raynaud's syndrome without gangrene: Secondary | ICD-10-CM | POA: Diagnosis not present

## 2021-01-22 DIAGNOSIS — E039 Hypothyroidism, unspecified: Secondary | ICD-10-CM

## 2021-01-22 DIAGNOSIS — Z1231 Encounter for screening mammogram for malignant neoplasm of breast: Secondary | ICD-10-CM

## 2021-01-22 DIAGNOSIS — E7800 Pure hypercholesterolemia, unspecified: Secondary | ICD-10-CM

## 2021-01-22 DIAGNOSIS — E559 Vitamin D deficiency, unspecified: Secondary | ICD-10-CM

## 2021-01-22 DIAGNOSIS — H6123 Impacted cerumen, bilateral: Secondary | ICD-10-CM

## 2021-01-22 DIAGNOSIS — I10 Essential (primary) hypertension: Secondary | ICD-10-CM | POA: Diagnosis not present

## 2021-01-22 DIAGNOSIS — R300 Dysuria: Secondary | ICD-10-CM

## 2021-01-22 MED ORDER — ZOSTER VAC RECOMB ADJUVANTED 50 MCG/0.5ML IM SUSR: 0.5000 mL | Freq: Once | INTRAMUSCULAR | 0 refills | Status: AC

## 2021-01-22 MED ORDER — TETANUS-DIPHTH-ACELL PERTUSSIS 5-2.5-18.5 LF-MCG/0.5 IM SUSP: 0.5000 mL | Freq: Once | INTRAMUSCULAR | 0 refills | Status: AC

## 2021-01-22 NOTE — Progress Notes (Signed)
[facility] [place], [place] [address]  Internal MEDICINE  Office Visit Note  Patient Name: Theresa Yang  [id number]  [id number]  Date of Service: 01/22/2021  Chief Complaint  Patient presents with   Annual Exam   Hypertension   Hypothyroidism    HPI Theresa Yang presents for an annual well visit and physical exam. she has a history of hypertension and hypothyroidism. She has had 2 doses of the COVID vaccine. She needs her mammogram reordered. She lives at home alone and works at [employer] in [place] which she has worked at for 18 years. She is a nonsmoker, denies alcohol or recreational drug use. She does not need any medication refills today. Her blood pressure is well-controlled. Routine labs are due.  She denies any pain. She denies any other concerns or questions today.     Current Medication: Outpatient Encounter Medications as of 01/22/2021  Medication Sig   clotrimazole-betamethasone (LOTRISONE) cream Apply 1 application topically 2 (two) times daily.   desonide (DESOWEN) 0.05 % cream Apply topically.   EUTHYROX 50 MCG tablet TAKE 1 TABLET BY MOUTH ONCE DAILY BEFORE BREAKFAST   fluocinonide cream (LIDEX) AB-123456789 % Apply 1 application topically 2 (two) times daily.   nystatin cream (MYCOSTATIN) Apply topically 3 (three) times daily.   telmisartan-hydrochlorothiazide (MICARDIS HCT) 80-12.5 MG tablet 1 tablet daily.    [DISCONTINUED] Tdap (BOOSTRIX) 5-2.5-18.5 LF-MCG/0.5 injection Inject 0.5 mLs into the muscle once.   [DISCONTINUED] Zoster Vaccine Adjuvanted [facility]) injection Inject 0.5 mLs into the muscle once.   [EXPIRED] Tdap (BOOSTRIX) 5-2.5-18.5 LF-MCG/0.5 injection Inject 0.5 mLs into the muscle once for 1 dose.   [EXPIRED] Zoster Vaccine Adjuvanted [facility]) injection Inject 0.5 mLs into the muscle once for 1 dose.   No facility-administered encounter medications on file as of 01/22/2021.    Surgical History: Past Surgical History:   Procedure Laterality Date   ABDOMINAL HYSTERECTOMY     COLONOSCOPY WITH PROPOFOL N/A 07/12/2018   Procedure: COLONOSCOPY WITH PROPOFOL;  Surgeon: Jonathon Bellows, MD;  Location: [facility] ENDOSCOPY;  Service: Gastroenterology;  Laterality: N/A;    Medical History: Past Medical History:  Diagnosis Date   Complication of anesthesia    Hypertension    Hypothyroidism    PONV (postoperative nausea and vomiting)    Thyroid disease     Family History: Family History  Problem Relation Age of Onset   Diabetes Mother    Hypothyroidism Father    Cancer Father    Breast cancer Neg Hx     Social History   Socioeconomic History   Marital status: Single    Spouse name: Not on file   Number of children: Not on file   Years of education: Not on file   Highest education level: Not on file  Occupational History   Not on file  Tobacco Use   Smoking status: Never   Smokeless tobacco: Never  Vaping Use   Vaping Use: Never used  Substance and Sexual Activity   Alcohol use: No   Drug use: No   Sexual activity: Not on file  Other Topics Concern   Not on file  Social History Narrative   Not on file   Social Determinants of Health   Financial Resource Strain: Not on file  Food Insecurity: Not on file  Transportation Needs: Not on file  Physical Activity: Not on file  Stress: Not on file  Social Connections: Not on file  Intimate Partner Violence: Not on file  Review of Systems  Constitutional:  Negative for activity change, appetite change, chills, fatigue, fever and unexpected weight change.  HENT: Negative.  Negative for congestion, ear pain, rhinorrhea, sore throat and trouble swallowing.   Eyes: Negative.   Respiratory: Negative.  Negative for cough, chest tightness, shortness of breath and wheezing.   Cardiovascular: Negative.  Negative for chest pain.  Gastrointestinal: Negative.  Negative for abdominal pain, blood in stool, constipation, diarrhea, nausea and vomiting.   Endocrine: Negative.   Genitourinary: Negative.  Negative for difficulty urinating, dysuria, frequency, hematuria and urgency.  Musculoskeletal: Negative.  Negative for arthralgias, back pain, joint swelling, myalgias and neck pain.  Skin: Negative.  Negative for rash and wound.  Allergic/Immunologic: Negative.  Negative for immunocompromised state.  Neurological: Negative.  Negative for dizziness, seizures, numbness and headaches.  Hematological: Negative.   Psychiatric/Behavioral: Negative.  Negative for behavioral problems, self-injury and suicidal ideas. The patient is not nervous/anxious.    Vital Signs: BP 123/62   Pulse 62 Comment: 59  Temp (!) 97.5 F (36.4 C)   Resp 16   Ht '5\' 2"'$  (1.575 m)   Wt 161 lb 12.8 oz (73.4 kg)   SpO2 98%   BMI 29.59 kg/m    Physical Exam Vitals reviewed.  Constitutional:      General: She is awake. She is not in acute distress.    Appearance: Normal appearance. She is well-developed, well-groomed and overweight. She is not ill-appearing or diaphoretic.  HENT:     Head: Normocephalic and atraumatic.     Right Ear: External ear normal. There is impacted cerumen.     Left Ear: External ear normal. There is impacted cerumen.     Nose: Nose normal. No congestion or rhinorrhea.     Mouth/Throat:     Lips: Pink.     Mouth: Mucous membranes are moist.     Pharynx: Oropharynx is clear. Uvula midline. No oropharyngeal exudate or posterior oropharyngeal erythema.  Eyes:     General: Lids are normal. Vision grossly intact. Gaze aligned appropriately. No scleral icterus.       Right eye: No discharge.        Left eye: No discharge.     Extraocular Movements: Extraocular movements intact.     Conjunctiva/sclera: Conjunctivae normal.     Pupils: Pupils are equal, round, and reactive to light.     Funduscopic exam:    Right eye: Red reflex present.        Left eye: Red reflex present. Neck:     Thyroid: No thyromegaly.     Vascular: No JVD.      Trachea: Trachea and phonation normal. No tracheal deviation.  Cardiovascular:     Rate and Rhythm: Normal rate and regular rhythm.     Pulses:          Carotid pulses are 3+ on the right side and 3+ on the left side.      Radial pulses are 2+ on the right side and 2+ on the left side.       Posterior tibial pulses are 2+ on the right side and 2+ on the left side.     Heart sounds: Normal heart sounds, S1 normal and S2 normal. No murmur heard.   No friction rub. No gallop.  Pulmonary:     Effort: Pulmonary effort is normal. No accessory muscle usage or respiratory distress.     Breath sounds: Normal breath sounds and air entry. No stridor. No wheezing or rales.  Chest:     Chest wall: No tenderness.     Comments: Patient have mammogram ordered, will call to schedule. Does self breast exams at home. Declined clinical breast exam.  Abdominal:     General: Bowel sounds are normal. There is no distension.     Palpations: Abdomen is soft. There is no mass.     Tenderness: There is no abdominal tenderness. There is no guarding or rebound.  Musculoskeletal:        General: No tenderness or deformity. Normal range of motion.     Cervical back: Normal range of motion and neck supple.     Right lower leg: No edema.     Left lower leg: No edema.  Lymphadenopathy:     Cervical: No cervical adenopathy.  Skin:    General: Skin is warm and dry.     Capillary Refill: Capillary refill takes less than 2 seconds.     Coloration: Skin is not pale.     Findings: No erythema or rash.  Neurological:     Mental Status: She is alert and oriented to person, place, and time.     Motor: No abnormal muscle tone.     Gait: Gait normal.     Deep Tendon Reflexes: Reflexes are normal and symmetric.  Psychiatric:        Mood and Affect: Mood and affect normal.        Behavior: Behavior normal. Behavior is cooperative.        Thought Content: Thought content normal.        Judgment: Judgment normal.      Assessment/Plan: 1. Encounter for general adult medical examination with abnormal findings Age-appropriate preventive screenings and vaccinations discussed, annual physical exam completed. Routine labs for health maintenance ordered, see below. PHM updated.   2. Essential hypertension Stable on current medication.   3. Acquired hypothyroidism Recheck thyroid levels.  - TSH + free T4  4. Raynaud's phenomenon without gangrene Labs ordered.  - CBC with Differential/Platelet - Comprehensive Metabolic Panel (CMET)  5. Elevated low-density lipoprotein level Recheck lipid panel - Lipid Profile  6. Vitamin D deficiency Recheck vitamin D level.  - Vitamin D (25 hydroxy)  7. Encounter for screening mammogram for malignant neoplasm of breast Mammogram ordered. - MM Digital Screening; Future  8. Dysuria Routine urinalysis done.  - UA/M w/rflx Culture, Routine     General Counseling: Theresa Yang verbalizes understanding of the findings of todays visit and agrees with plan of treatment. I have discussed any further diagnostic evaluation that may be needed or ordered today. We also reviewed her medications today. she has been encouraged to call the office with any questions or concerns that should arise related to todays visit.    Orders Placed This Encounter  Procedures   Microscopic Examination   Urine Culture, Reflex   MM Digital Screening   UA/M w/rflx Culture, Routine   TSH + free T4   CBC with Differential/Platelet   Comprehensive Metabolic Panel (CMET)   Lipid Profile   Vitamin D (25 hydroxy)    Meds ordered this encounter  Medications   Zoster Vaccine Adjuvanted [facility]) injection    Sig: Inject 0.5 mLs into the muscle once for 1 dose.    Dispense:  0.5 mL    Refill:  0   Tdap (BOOSTRIX) 5-2.5-18.5 LF-MCG/0.5 injection    Sig: Inject 0.5 mLs into the muscle once for 1 dose.    Dispense:  0.5 mL    Refill:  0    Return in about 6 months (around  07/25/2021) for F/U, med refill, Taunya Goral PCP.   Total time spent:30 Minutes Time spent includes review of chart, medications, test results, and follow up plan with the patient.   [place] Controlled Substance Database was reviewed by me.  This patient was seen by Jonetta Osgood, FNP-C in collaboration with Dr. Clayborn Bigness as a part of collaborative care agreement.  Talea Manges R. Valetta Fuller, MSN, FNP-C Internal medicine

## 2021-01-22 NOTE — Progress Notes (Deleted)
[facility] [place], [place] [address]  Internal MEDICINE  Office Visit Note  Patient Name: Theresa Yang  [id number]  [id number]  Date of Service: 01/22/2021  Chief Complaint  Patient presents with   Annual Exam   Hypertension   Hypothyroidism    Hypertension  Casy presents for a follow-up visit for No refills needed Pap 2024 Colon 2025 Mammo reordered.      Current Medication: Outpatient Encounter Medications as of 01/22/2021  Medication Sig   clotrimazole-betamethasone (LOTRISONE) cream Apply 1 application topically 2 (two) times daily.   desonide (DESOWEN) 0.05 % cream Apply topically.   EUTHYROX 50 MCG tablet TAKE 1 TABLET BY MOUTH ONCE DAILY BEFORE BREAKFAST   fluocinonide cream (LIDEX) [id number] % Apply 1 application topically 2 (two) times daily.   nystatin cream (MYCOSTATIN) Apply topically 3 (three) times daily.   Tdap (BOOSTRIX) 5-2.5-18.5 LF-MCG/0.5 injection Inject 0.5 mLs into the muscle once.   telmisartan-hydrochlorothiazide (MICARDIS HCT) 80-12.5 MG tablet 1 tablet daily.    Zoster Vaccine Adjuvanted [facility]) injection Inject 0.5 mLs into the muscle once.   No facility-administered encounter medications on file as of 01/22/2021.    Surgical History: Past Surgical History:  Procedure Laterality Date   ABDOMINAL HYSTERECTOMY     COLONOSCOPY WITH PROPOFOL N/A 07/12/2018   Procedure: COLONOSCOPY WITH PROPOFOL;  Surgeon: Jonathon Bellows, MD;  Location: [facility] ENDOSCOPY;  Service: Gastroenterology;  Laterality: N/A;    Medical History: Past Medical History:  Diagnosis Date   Complication of anesthesia    Hypertension    Hypothyroidism    PONV (postoperative nausea and vomiting)    Thyroid disease     Family History: Family History  Problem Relation Age of Onset   Diabetes Mother    Hypothyroidism Father    Cancer Father    Breast cancer Neg Hx     Social History   Socioeconomic History   Marital status: Single     Spouse name: Not on file   Number of children: Not on file   Years of education: Not on file   Highest education level: Not on file  Occupational History   Not on file  Tobacco Use   Smoking status: Never   Smokeless tobacco: Never  Vaping Use   Vaping Use: Never used  Substance and Sexual Activity   Alcohol use: No   Drug use: No   Sexual activity: Not on file  Other Topics Concern   Not on file  Social History Narrative   Not on file   Social Determinants of Health   Financial Resource Strain: Not on file  Food Insecurity: Not on file  Transportation Needs: Not on file  Physical Activity: Not on file  Stress: Not on file  Social Connections: Not on file  Intimate Partner Violence: Not on file      Review of Systems  Vital Signs: BP 123/62   Pulse (!) 59   Temp (!) 97.5 F (36.4 C)   Resp 16   Ht '5\' 2"'$  (1.575 m)   Wt 161 lb 12.8 oz (73.4 kg)   SpO2 98%   BMI 29.59 kg/m    Physical Exam Vitals reviewed.  Constitutional:      General: She is not in acute distress.    Appearance: Normal appearance. She is well-developed. She is not diaphoretic.  HENT:     Head: Normocephalic and atraumatic.     Right Ear: Tympanic membrane, ear canal and external ear normal.  Left Ear: Tympanic membrane, ear canal and external ear normal.     Nose: Nose normal.     Mouth/Throat:     Pharynx: No oropharyngeal exudate.  Eyes:     General: No scleral icterus.       Right eye: No discharge.        Left eye: No discharge.     Conjunctiva/sclera: Conjunctivae normal.     Pupils: Pupils are equal, round, and reactive to light.  Neck:     Thyroid: No thyromegaly.     Vascular: No JVD.     Trachea: No tracheal deviation.  Cardiovascular:     Rate and Rhythm: Normal rate and regular rhythm.     Heart sounds: Normal heart sounds. No murmur heard.   No friction rub. No gallop.  Pulmonary:     Effort: Pulmonary effort is normal. No respiratory distress.     Breath  sounds: Normal breath sounds. No stridor. No wheezing or rales.  Chest:     Chest wall: No tenderness.  Abdominal:     General: Bowel sounds are normal. There is no distension.     Palpations: Abdomen is soft. There is no mass.     Tenderness: There is no abdominal tenderness. There is no guarding or rebound.  Musculoskeletal:        General: No tenderness or deformity. Normal range of motion.     Cervical back: Normal range of motion and neck supple.  Lymphadenopathy:     Cervical: No cervical adenopathy.  Skin:    General: Skin is warm and dry.     Capillary Refill: Capillary refill takes less than 2 seconds.     Coloration: Skin is not pale.     Findings: No erythema or rash.  Neurological:     Mental Status: She is alert.     Cranial Nerves: No cranial nerve deficit.     Motor: No abnormal muscle tone.     Coordination: Coordination normal.     Deep Tendon Reflexes: Reflexes are normal and symmetric.  Psychiatric:        Behavior: Behavior normal.        Thought Content: Thought content normal.        Judgment: Judgment normal.       Assessment/Plan:   General Counseling: [name] understanding of the findings of todays visit and agrees with plan of treatment. I have discussed any further diagnostic evaluation that may be needed or ordered today. We also reviewed her medications today. she has been encouraged to call the office with any questions or concerns that should arise related to todays visit.    Orders Placed This Encounter  Procedures   UA/M w/rflx Culture, Routine    No orders of the defined types were placed in this encounter.   No follow-ups on file.   Total time spent:*** Minutes Time spent includes review of chart, medications, test results, and follow up plan with the patient.   [place] Controlled Substance Database was reviewed by me.  This patient was seen by Jonetta Osgood, FNP-C in collaboration with Dr. Clayborn Bigness as a part of  collaborative care agreement.   Raydin Bielinski R. Valetta Fuller, MSN, FNP-C Internal medicine

## 2021-01-24 LAB — UA/M W/RFLX CULTURE, ROUTINE
Bilirubin, UA: NEGATIVE
Glucose, UA: NEGATIVE
Ketones, UA: NEGATIVE
Nitrite, UA: NEGATIVE
Protein,UA: NEGATIVE
RBC, UA: NEGATIVE
Specific Gravity, UA: 1.008 (ref 1.005–1.030)
Urobilinogen, Ur: 0.2 mg/dL (ref 0.2–1.0)
pH, UA: 6.5 (ref 5.0–7.5)

## 2021-01-24 LAB — MICROSCOPIC EXAMINATION
Casts: NONE SEEN /lpf
RBC, Urine: NONE SEEN /hpf (ref 0–2)

## 2021-01-24 LAB — URINE CULTURE, REFLEX

## 2021-02-11 ENCOUNTER — Other Ambulatory Visit: Payer: Self-pay | Admitting: Nurse Practitioner

## 2021-02-11 DIAGNOSIS — N6489 Other specified disorders of breast: Secondary | ICD-10-CM

## 2021-03-25 DIAGNOSIS — E782 Mixed hyperlipidemia: Secondary | ICD-10-CM | POA: Diagnosis not present

## 2021-03-25 DIAGNOSIS — I10 Essential (primary) hypertension: Secondary | ICD-10-CM | POA: Diagnosis not present

## 2021-03-25 DIAGNOSIS — I340 Nonrheumatic mitral (valve) insufficiency: Secondary | ICD-10-CM | POA: Diagnosis not present

## 2021-03-26 ENCOUNTER — Telehealth: Payer: Self-pay

## 2021-03-26 DIAGNOSIS — Z20822 Contact with and (suspected) exposure to covid-19: Secondary | ICD-10-CM | POA: Diagnosis not present

## 2021-03-26 DIAGNOSIS — Z03818 Encounter for observation for suspected exposure to other biological agents ruled out: Secondary | ICD-10-CM | POA: Diagnosis not present

## 2021-03-26 NOTE — Telephone Encounter (Signed)
Pt called c/o having chills, maybe fever and sore throat that started 03/25/21 in the afternoon.  Pt also stated she had some diarrhea on Tuesday.  I advised for pt to go to [facility] and have covid testing done.  Pt didn't have any test at home.  Sent pt to appt desk to have an appt scheduled

## 2021-03-27 ENCOUNTER — Encounter: Payer: Self-pay | Admitting: Physician Assistant

## 2021-03-27 ENCOUNTER — Telehealth: Payer: BC Managed Care – PPO | Admitting: Physician Assistant

## 2021-03-27 ENCOUNTER — Other Ambulatory Visit: Payer: Self-pay

## 2021-03-27 ENCOUNTER — Telehealth: Payer: Self-pay

## 2021-03-27 VITALS — Ht 62.0 in | Wt 160.0 lb

## 2021-03-27 DIAGNOSIS — U071 COVID-19: Secondary | ICD-10-CM

## 2021-03-27 MED ORDER — PREDNISONE 10 MG PO TABS: ORAL_TABLET | ORAL | 0 refills | Status: DC

## 2021-03-27 MED ORDER — AZITHROMYCIN 250 MG PO TABS: ORAL_TABLET | ORAL | 0 refills | Status: DC

## 2021-03-27 NOTE — Telephone Encounter (Signed)
Spoke to pt, returned her call, she said her thyroid med needed approval for a manufacture change but she had received a call from pharmacy and thinks it has been resolved. Asked her to confirm with pharmacy as our phones will be rolled at [facility] today.

## 2021-03-27 NOTE — Progress Notes (Signed)
[facility] [place], [place] [address]  Internal MEDICINE  Telephone Visit  Patient Name: Theresa Yang  [id number]  [id number]  Date of Service: 03/27/2021  I connected with the patient at 8:34 by telephone and verified the patients identity using two identifiers.   I discussed the limitations, risks, security and privacy concerns of performing an evaluation and management service by telephone and the availability of in person appointments. I also discussed with the patient that there may be a patient responsible charge related to the service.  The patient expressed understanding and agrees to proceed.    Chief Complaint  Patient presents with   Telephone Assessment    [id number]    Telephone Screen    Tested positive for covid    Cough   Sinusitis   Sore Throat   Chills    HPI Pt is here for a virtual sick visit. -She tested positive for covid yesterday -Tuesday started with runny BM, but that resovled. She has been experiencing cough, sinus congestion, sore throat, and chills since Wednesday.  -Has tried tylenol which helped with fever chills initially and states her fever has since resolved. -Denies any SOB or wheezing  Current Medication: Outpatient Encounter Medications as of 03/27/2021  Medication Sig   azithromycin (ZITHROMAX) 250 MG tablet Take one tab a day for 10 days for uri   clotrimazole-betamethasone (LOTRISONE) cream Apply 1 application topically 2 (two) times daily.   desonide (DESOWEN) 0.05 % cream Apply topically.   EUTHYROX 50 MCG tablet TAKE 1 TABLET BY MOUTH ONCE DAILY BEFORE BREAKFAST   fluocinonide cream (LIDEX) 1.00 % Apply 1 application topically 2 (two) times daily.   nystatin cream (MYCOSTATIN) Apply topically 3 (three) times daily.   predniSONE (DELTASONE) 10 MG tablet Take one tab 3 x day for 3 days, then take one tab 2 x a day for 3 days and then take one tab a day for 3 days for URI    telmisartan-hydrochlorothiazide (MICARDIS HCT) 80-12.5 MG tablet 1 tablet daily.    No facility-administered encounter medications on file as of 03/27/2021.    Surgical History: Past Surgical History:  Procedure Laterality Date   ABDOMINAL HYSTERECTOMY     COLONOSCOPY WITH PROPOFOL N/A 07/12/2018   Procedure: COLONOSCOPY WITH PROPOFOL;  Surgeon: Jonathon Bellows, MD;  Location: [facility] ENDOSCOPY;  Service: Gastroenterology;  Laterality: N/A;    Medical History: Past Medical History:  Diagnosis Date   Complication of anesthesia    Hypertension    Hypothyroidism    PONV (postoperative nausea and vomiting)    Thyroid disease     Family History: Family History  Problem Relation Age of Onset   Diabetes Mother    Hypothyroidism Father    Cancer Father    Breast cancer Neg Hx     Social History   Socioeconomic History   Marital status: Single    Spouse name: Not on file   Number of children: Not on file   Years of education: Not on file   Highest education level: Not on file  Occupational History   Not on file  Tobacco Use   Smoking status: Never   Smokeless tobacco: Never  Vaping Use   Vaping Use: Never used  Substance and Sexual Activity   Alcohol use: No   Drug use: No   Sexual activity: Not on file  Other Topics Concern   Not on file  Social History Narrative   Not on file   Social  Determinants of Health   Financial Resource Strain: Not on file  Food Insecurity: Not on file  Transportation Needs: Not on file  Physical Activity: Not on file  Stress: Not on file  Social Connections: Not on file  Intimate Partner Violence: Not on file      Review of Systems  Constitutional:  Positive for chills, fatigue and fever.  HENT:  Positive for congestion, postnasal drip and sore throat. Negative for mouth sores.   Respiratory:  Positive for cough. Negative for shortness of breath and wheezing.   Cardiovascular:  Negative for chest pain.  Genitourinary:  Negative for  flank pain.  Psychiatric/Behavioral: Negative.     Vital Signs: Ht 5\' 2"  (1.575 m)   Wt 160 lb (72.6 kg)   BMI 29.26 kg/m    Observation/Objective:  Pt is able to carry out conversation   Assessment/Plan: 1. Acute COVID-19 Will start on zpak and prednisone to help lesson symptoms. May take mucinex and tylenol as needed. Advised to stay well hydrated and rest. - azithromycin (ZITHROMAX) 250 MG tablet; Take one tab a day for 10 days for uri  Dispense: 10 tablet; Refill: 0 - predniSONE (DELTASONE) 10 MG tablet; Take one tab 3 x day for 3 days, then take one tab 2 x a day for 3 days and then take one tab a day for 3 days for URI  Dispense: 18 tablet; Refill: 0   General Counseling: Theresa Yang verbalizes understanding of the findings of today's phone visit and agrees with plan of treatment. I have discussed any further diagnostic evaluation that may be needed or ordered today. We also reviewed her medications today. she has been encouraged to call the office with any questions or concerns that should arise related to todays visit.    No orders of the defined types were placed in this encounter.   Meds ordered this encounter  Medications   azithromycin (ZITHROMAX) 250 MG tablet    Sig: Take one tab a day for 10 days for uri    Dispense:  10 tablet    Refill:  0   predniSONE (DELTASONE) 10 MG tablet    Sig: Take one tab 3 x day for 3 days, then take one tab 2 x a day for 3 days and then take one tab a day for 3 days for URI    Dispense:  18 tablet    Refill:  0    Time spent:25 Minutes    Dr Lavera Guise Internal medicine

## 2021-03-30 ENCOUNTER — Telehealth: Payer: Self-pay

## 2021-03-30 NOTE — Telephone Encounter (Signed)
FMLA paperwork received from [place] and on [employer].

## 2021-04-01 ENCOUNTER — Inpatient Hospital Stay: Admission: RE | Admit: 2021-04-01 | Payer: BC Managed Care – PPO | Source: Ambulatory Visit

## 2021-04-03 ENCOUNTER — Telehealth: Payer: Self-pay

## 2021-04-03 NOTE — Telephone Encounter (Signed)
Disability paperwork placed on  [name]  desk to be completed.

## 2021-04-03 NOTE — Telephone Encounter (Signed)
Leave of absence paperwork completed by provider and faxed back to [place] at [phone]. Original given to Nimisha.

## 2021-04-15 ENCOUNTER — Other Ambulatory Visit: Payer: Self-pay

## 2021-04-15 ENCOUNTER — Ambulatory Visit
Admission: RE | Admit: 2021-04-15 | Discharge: 2021-04-15 | Disposition: A | Discharge status: Home / Self Care | Payer: BC Managed Care – PPO | Source: Ambulatory Visit | Attending: Nurse Practitioner | Admitting: Nurse Practitioner

## 2021-04-15 DIAGNOSIS — N6489 Other specified disorders of breast: Secondary | ICD-10-CM | POA: Insufficient documentation

## 2021-04-15 DIAGNOSIS — R922 Inconclusive mammogram: Secondary | ICD-10-CM | POA: Diagnosis not present

## 2021-04-15 NOTE — Progress Notes (Signed)
Please notify patient that her results were BI-RADS 2 benign, follow up in 1 year.

## 2021-04-17 NOTE — Progress Notes (Signed)
LMOM wanted to give pt results mammogram was benign and to follow up in 1 year

## 2021-04-20 NOTE — Progress Notes (Signed)
LMOM pt needs to know mammogram results are benign and to follow up in 1 year

## 2021-06-30 ENCOUNTER — Other Ambulatory Visit: Payer: Self-pay | Admitting: Internal Medicine

## 2021-07-22 ENCOUNTER — Ambulatory Visit: Payer: BC Managed Care – PPO | Admitting: Nurse Practitioner

## 2021-07-22 ENCOUNTER — Encounter: Payer: Self-pay | Admitting: Nurse Practitioner

## 2021-07-22 ENCOUNTER — Other Ambulatory Visit: Payer: Self-pay

## 2021-07-22 VITALS — BP 134/70 | HR 64 | Temp 98.4°F | Resp 16 | Ht 62.0 in | Wt 161.0 lb

## 2021-07-22 DIAGNOSIS — Z23 Encounter for immunization: Secondary | ICD-10-CM

## 2021-07-22 DIAGNOSIS — E039 Hypothyroidism, unspecified: Secondary | ICD-10-CM

## 2021-07-22 DIAGNOSIS — I7300 Raynaud's syndrome without gangrene: Secondary | ICD-10-CM

## 2021-07-22 DIAGNOSIS — B372 Candidiasis of skin and nail: Secondary | ICD-10-CM | POA: Diagnosis not present

## 2021-07-22 MED ORDER — NYSTATIN 100000 UNIT/GM EX CREA: TOPICAL_CREAM | Freq: Three times a day (TID) | CUTANEOUS | 3 refills | Status: DC

## 2021-07-22 MED ORDER — CLOTRIMAZOLE-BETAMETHASONE 1-0.05 % EX CREA: 1.0000 "application " | TOPICAL_CREAM | Freq: Two times a day (BID) | CUTANEOUS | 2 refills | Status: DC

## 2021-07-22 MED ORDER — ZOSTER VAC RECOMB ADJUVANTED 50 MCG/0.5ML IM SUSR: 0.5000 mL | Freq: Once | INTRAMUSCULAR | 0 refills | Status: AC

## 2021-07-22 NOTE — Progress Notes (Unsigned)
[facility] [place], [place] [address]  Internal MEDICINE  Office Visit Note  Patient Name: Theresa Yang  [id number]  [id number]  Date of Service: 07/22/2021  Chief Complaint  Patient presents with   Follow-up   Hypertension   Hypothyroidism   Medication Refill    HPI Theresa Yang presents for a follow-up visit for  Blood pressure stable, refills  Off and on yeast under breast.   Cont thyroid medication.     Current Medication: Outpatient Encounter Medications as of 07/22/2021  Medication Sig   desonide (DESOWEN) 0.05 % cream Apply topically.   fluocinonide cream (LIDEX) 4.01 % Apply 1 application topically 2 (two) times daily.   levothyroxine (SYNTHROID) 50 MCG tablet TAKE 1 TABLET BY MOUTH ONCE DAILY BEFORE BREAKFAST   telmisartan-hydrochlorothiazide (MICARDIS HCT) 80-12.5 MG tablet 1 tablet daily.    Zoster Vaccine Adjuvanted [facility]) injection Inject 0.5 mLs into the muscle once for 1 dose.   [DISCONTINUED] azithromycin (ZITHROMAX) 250 MG tablet Take one tab a day for 10 days for uri   [DISCONTINUED] clotrimazole-betamethasone (LOTRISONE) cream Apply 1 application topically 2 (two) times daily.   [DISCONTINUED] nystatin cream (MYCOSTATIN) Apply topically 3 (three) times daily.   [DISCONTINUED] predniSONE (DELTASONE) 10 MG tablet Take one tab 3 x day for 3 days, then take one tab 2 x a day for 3 days and then take one tab a day for 3 days for URI   clotrimazole-betamethasone (LOTRISONE) cream Apply 1 application topically 2 (two) times daily.   nystatin cream (MYCOSTATIN) Apply topically 3 (three) times daily.   No facility-administered encounter medications on file as of 07/22/2021.    Surgical History: Past Surgical History:  Procedure Laterality Date   ABDOMINAL HYSTERECTOMY     COLONOSCOPY WITH PROPOFOL N/A 07/12/2018   Procedure: COLONOSCOPY WITH PROPOFOL;  Surgeon: Jonathon Bellows, MD;  Location: [facility] ENDOSCOPY;  Service:  Gastroenterology;  Laterality: N/A;    Medical History: Past Medical History:  Diagnosis Date   Complication of anesthesia    Hypertension    Hypothyroidism    PONV (postoperative nausea and vomiting)    Thyroid disease     Family History: Family History  Problem Relation Age of Onset   Diabetes Mother    Hypothyroidism Father    Cancer Father    Breast cancer Neg Hx     Social History   Socioeconomic History   Marital status: Single    Spouse name: Not on file   Number of children: Not on file   Years of education: Not on file   Highest education level: Not on file  Occupational History   Not on file  Tobacco Use   Smoking status: Never   Smokeless tobacco: Never  Vaping Use   Vaping Use: Never used  Substance and Sexual Activity   Alcohol use: No   Drug use: No   Sexual activity: Not on file  Other Topics Concern   Not on file  Social History Narrative   Not on file   Social Determinants of Health   Financial Resource Strain: Not on file  Food Insecurity: Not on file  Transportation Needs: Not on file  Physical Activity: Not on file  Stress: Not on file  Social Connections: Not on file  Intimate Partner Violence: Not on file      Review of Systems  Constitutional:  Negative for chills, fatigue and unexpected weight change.  HENT:  Negative for congestion, rhinorrhea, sneezing and sore throat.  Eyes:  Negative for redness.  Respiratory:  Negative for cough, chest tightness and shortness of breath.   Cardiovascular:  Negative for chest pain and palpitations.  Gastrointestinal:  Negative for abdominal pain, constipation, diarrhea, nausea and vomiting.  Genitourinary:  Negative for dysuria and frequency.  Musculoskeletal:  Negative for arthralgias, back pain, joint swelling and neck pain.  Skin:  Negative for rash.  Neurological: Negative.  Negative for tremors and numbness.  Hematological:  Negative for adenopathy. Does not bruise/bleed easily.   Psychiatric/Behavioral:  Negative for behavioral problems (Depression), sleep disturbance and suicidal ideas. The patient is not nervous/anxious.    Vital Signs: BP 134/70    Pulse 64    Temp 98.4 F (36.9 C)    Resp 16    Ht 5\' 2"  (1.575 m)    Wt 161 lb (73 kg)    SpO2 98%    BMI 29.45 kg/m    Physical Exam Vitals reviewed.  Constitutional:      General: She is not in acute distress.    Appearance: Normal appearance. She is not ill-appearing.  HENT:     Head: Normocephalic and atraumatic.  Eyes:     Pupils: Pupils are equal, round, and reactive to light.  Cardiovascular:     Rate and Rhythm: Normal rate and regular rhythm.  Pulmonary:     Effort: Pulmonary effort is normal. No respiratory distress.  Neurological:     Mental Status: She is alert and oriented to person, place, and time.     Cranial Nerves: No cranial nerve deficit.     Coordination: Coordination normal.     Gait: Gait normal.  Psychiatric:        Mood and Affect: Mood normal.        Behavior: Behavior normal.       Assessment/Plan:   General Counseling: Theresa Yang verbalizes understanding of the findings of todays visit and agrees with plan of treatment. I have discussed any further diagnostic evaluation that may be needed or ordered today. We also reviewed her medications today. she has been encouraged to call the office with any questions or concerns that should arise related to todays visit.    No orders of the defined types were placed in this encounter.   Meds ordered this encounter  Medications   clotrimazole-betamethasone (LOTRISONE) cream    Sig: Apply 1 application topically 2 (two) times daily.    Dispense:  45 g    Refill:  2   nystatin cream (MYCOSTATIN)    Sig: Apply topically 3 (three) times daily.    Dispense:  30 g    Refill:  3   Zoster Vaccine Adjuvanted [facility]) injection    Sig: Inject 0.5 mLs into the muscle once for 1 dose.    Dispense:  0.5 mL    Refill:  0    Return  in about 6 months (around 01/19/2022) for previously scheduled, CPE, Wolfgang Finigan PCP.   Total time spent:30 Minutes Time spent includes review of chart, medications, test results, and follow up plan with the patient.   [place] Controlled Substance Database was reviewed by me.  This patient was seen by Jonetta Osgood, FNP-C in collaboration with Dr. Clayborn Bigness as a part of collaborative care agreement.   Joannah Gitlin R. Valetta Fuller, MSN, FNP-C Internal medicine

## 2021-08-05 DIAGNOSIS — E039 Hypothyroidism, unspecified: Secondary | ICD-10-CM | POA: Diagnosis not present

## 2021-08-05 DIAGNOSIS — I7300 Raynaud's syndrome without gangrene: Secondary | ICD-10-CM | POA: Diagnosis not present

## 2021-08-05 DIAGNOSIS — E559 Vitamin D deficiency, unspecified: Secondary | ICD-10-CM | POA: Diagnosis not present

## 2021-08-05 DIAGNOSIS — E7800 Pure hypercholesterolemia, unspecified: Secondary | ICD-10-CM | POA: Diagnosis not present

## 2021-08-06 LAB — COMPREHENSIVE METABOLIC PANEL
ALT: 26 IU/L (ref 0–32)
AST: 21 IU/L (ref 0–40)
Albumin/Globulin Ratio: 1.8 (ref 1.2–2.2)
Albumin: 4.6 g/dL (ref 3.8–4.9)
Alkaline Phosphatase: 68 IU/L (ref 44–121)
BUN/Creatinine Ratio: 15 (ref 9–23)
BUN: 14 mg/dL (ref 6–24)
Bilirubin Total: 0.4 mg/dL (ref 0.0–1.2)
CO2: 25 mmol/L (ref 20–29)
Calcium: 9.5 mg/dL (ref 8.7–10.2)
Chloride: 102 mmol/L (ref 96–106)
Creatinine, Ser: 0.93 mg/dL (ref 0.57–1.00)
Globulin, Total: 2.6 g/dL (ref 1.5–4.5)
Glucose: 101 mg/dL — ABNORMAL HIGH (ref 70–99)
Potassium: 4.6 mmol/L (ref 3.5–5.2)
Sodium: 139 mmol/L (ref 134–144)
Total Protein: 7.2 g/dL (ref 6.0–8.5)
eGFR: 73 mL/min/{1.73_m2} (ref >59)

## 2021-08-06 LAB — CBC WITH DIFFERENTIAL/PLATELET
Basophils Absolute: 0.1 10*3/uL (ref 0.0–0.2)
Basos: 1 %
EOS (ABSOLUTE): 0.4 10*3/uL (ref 0.0–0.4)
Eos: 8 %
Hematocrit: 41 % (ref 34.0–46.6)
Hemoglobin: 14.2 g/dL (ref 11.1–15.9)
Immature Grans (Abs): 0 10*3/uL (ref 0.0–0.1)
Immature Granulocytes: 0 %
Lymphocytes Absolute: 1.3 10*3/uL (ref 0.7–3.1)
Lymphs: 27 %
MCH: 32.3 pg (ref 26.6–33.0)
MCHC: 34.6 g/dL (ref 31.5–35.7)
MCV: 93 fL (ref 79–97)
Monocytes Absolute: 0.5 10*3/uL (ref 0.1–0.9)
Monocytes: 11 %
Neutrophils Absolute: 2.6 10*3/uL (ref 1.4–7.0)
Neutrophils: 53 %
Platelets: 198 10*3/uL (ref 150–450)
RBC: 4.4 x10E6/uL (ref 3.77–5.28)
RDW: 11.2 % — ABNORMAL LOW (ref 11.7–15.4)
WBC: 4.9 10*3/uL (ref 3.4–10.8)

## 2021-08-06 LAB — TSH+FREE T4
Free T4: 1.35 ng/dL (ref 0.82–1.77)
TSH: 3.09 u[IU]/mL (ref 0.450–4.500)

## 2021-08-06 LAB — LIPID PANEL
Chol/HDL Ratio: 3 ratio (ref 0.0–4.4)
Cholesterol, Total: 170 mg/dL (ref 100–199)
HDL: 56 mg/dL (ref >39)
LDL Chol Calc (NIH): 107 mg/dL — ABNORMAL HIGH (ref 0–99)
Triglycerides: 33 mg/dL (ref 0–149)
VLDL Cholesterol Cal: 7 mg/dL (ref 5–40)

## 2021-08-06 LAB — VITAMIN D 25 HYDROXY (VIT D DEFICIENCY, FRACTURES): Vit D, 25-Hydroxy: 28.8 ng/mL — ABNORMAL LOW (ref 30.0–100.0)

## 2021-08-26 ENCOUNTER — Encounter: Payer: Self-pay | Admitting: Nurse Practitioner

## 2021-12-02 ENCOUNTER — Ambulatory Visit (INDEPENDENT_AMBULATORY_CARE_PROVIDER_SITE_OTHER): Payer: BC Managed Care – PPO

## 2021-12-02 DIAGNOSIS — H6123 Impacted cerumen, bilateral: Secondary | ICD-10-CM

## 2021-12-02 NOTE — Progress Notes (Signed)
Washed out both ears and removed large chunks of ear wax from both ears. Pt did not feel dizzy, and said hearing was better. Inside of left ear was tender per pt and did look a little pink around where ear wax was.

## 2021-12-13 ENCOUNTER — Other Ambulatory Visit: Payer: Self-pay | Admitting: Nurse Practitioner

## 2022-01-06 ENCOUNTER — Encounter: Payer: BC Managed Care – PPO | Admitting: Nurse Practitioner

## 2022-01-27 ENCOUNTER — Telehealth: Payer: Self-pay

## 2022-01-27 NOTE — Telephone Encounter (Signed)
Left vm and sent mychart message to confirm 02/03/22 appointment-Toni

## 2022-02-03 ENCOUNTER — Ambulatory Visit (INDEPENDENT_AMBULATORY_CARE_PROVIDER_SITE_OTHER): Payer: BC Managed Care – PPO | Admitting: Nurse Practitioner

## 2022-02-03 ENCOUNTER — Encounter: Payer: Self-pay | Admitting: Nurse Practitioner

## 2022-02-03 VITALS — BP 126/70 | HR 66 | Temp 98.3°F | Resp 16 | Ht 62.0 in | Wt 164.8 lb

## 2022-02-03 DIAGNOSIS — R300 Dysuria: Secondary | ICD-10-CM | POA: Diagnosis not present

## 2022-02-03 DIAGNOSIS — Z0001 Encounter for general adult medical examination with abnormal findings: Secondary | ICD-10-CM | POA: Diagnosis not present

## 2022-02-03 DIAGNOSIS — Z23 Encounter for immunization: Secondary | ICD-10-CM

## 2022-02-03 MED ORDER — TETANUS-DIPHTH-ACELL PERTUSSIS 5-2.5-18.5 LF-MCG/0.5 IM SUSP: 0.5000 mL | Freq: Once | INTRAMUSCULAR | 0 refills | Status: AC

## 2022-02-03 MED ORDER — ZOSTER VAC RECOMB ADJUVANTED 50 MCG/0.5ML IM SUSR: 0.5000 mL | Freq: Once | INTRAMUSCULAR | 0 refills | Status: AC

## 2022-02-03 NOTE — Progress Notes (Signed)
[facility] [place], [place] [address]  Internal MEDICINE  Office Visit Note  Patient Name: Theresa Yang  [id number]  [id number]  Date of Service: 02/03/2022  Chief Complaint  Patient presents with   Annual Exam   Hypertension    HPI Theresa Yang presents for an annual well visit and physical exam.  Well-appearing 56 year old female with hypertension, hypothyroidism, and high cholesterol.  -- lives at home alone and works at [employer] in [place] which Theresa Yang has worked at for 19 years. -- nonsmoker, denies alcohol or recreational drug use.  --BP and other vital signs are stable and within normal limits.  --routine mammogram is due in November.  --routine colonoscopy is due in 2025. --pap smear due in 2024 next year. --routine labs were done in march, will defer until then No new or worsening pain. Requesting shingles vaccine and tetanus booster    Current Medication: Outpatient Encounter Medications as of 02/03/2022  Medication Sig   clotrimazole-betamethasone (LOTRISONE) cream Apply 1 application topically 2 (two) times daily.   desonide (DESOWEN) 0.05 % cream Apply topically.   fluocinonide cream (LIDEX) 3.79 % Apply 1 application topically 2 (two) times daily.   levothyroxine (SYNTHROID) 50 MCG tablet TAKE 1 TABLET BY MOUTH ONCE DAILY BEFORE BREAKFAST   nystatin cream (MYCOSTATIN) Apply topically 3 (three) times daily.   telmisartan-hydrochlorothiazide (MICARDIS HCT) 80-12.5 MG tablet 1 tablet daily.    [DISCONTINUED] Zoster Vaccine Adjuvanted [facility]) injection Inject 0.5 mLs into the muscle once.   Tdap (BOOSTRIX) 5-2.5-18.5 LF-MCG/0.5 injection Inject 0.5 mLs into the muscle once for 1 dose.   Zoster Vaccine Adjuvanted [facility]) injection Inject 0.5 mLs into the muscle once for 1 dose.   [DISCONTINUED] Tdap (BOOSTRIX) 5-2.5-18.5 LF-MCG/0.5 injection Inject 0.5 mLs into the muscle once.   No facility-administered encounter medications on  file as of 02/03/2022.    Surgical History: Past Surgical History:  Procedure Laterality Date   ABDOMINAL HYSTERECTOMY     COLONOSCOPY WITH PROPOFOL N/A 07/12/2018   Procedure: COLONOSCOPY WITH PROPOFOL;  Surgeon: Jonathon Bellows, MD;  Location: [facility] ENDOSCOPY;  Service: Gastroenterology;  Laterality: N/A;    Medical History: Past Medical History:  Diagnosis Date   Complication of anesthesia    Hypertension    Hypothyroidism    PONV (postoperative nausea and vomiting)    Thyroid disease     Family History: Family History  Problem Relation Age of Onset   Diabetes Mother    Hypothyroidism Father    Cancer Father    Breast cancer Neg Hx     Social History   Socioeconomic History   Marital status: Single    Spouse name: Not on file   Number of children: Not on file   Years of education: Not on file   Highest education level: Not on file  Occupational History   Not on file  Tobacco Use   Smoking status: Never   Smokeless tobacco: Never  Vaping Use   Vaping Use: Never used  Substance and Sexual Activity   Alcohol use: No   Drug use: No   Sexual activity: Not on file  Other Topics Concern   Not on file  Social History Narrative   Not on file   Social Determinants of Health   Financial Resource Strain: Not on file  Food Insecurity: Not on file  Transportation Needs: Not on file  Physical Activity: Not on file  Stress: Not on file  Social Connections: Not on file  Intimate  Partner Violence: Not on file      Review of Systems  Constitutional:  Negative for activity change, appetite change, chills, fatigue, fever and unexpected weight change.  HENT: Negative.  Negative for congestion, ear pain, rhinorrhea, sore throat and trouble swallowing.   Eyes: Negative.   Respiratory: Negative.  Negative for cough, chest tightness, shortness of breath and wheezing.   Cardiovascular: Negative.  Negative for chest pain.  Gastrointestinal: Negative.  Negative for abdominal  pain, blood in stool, constipation, diarrhea, nausea and vomiting.  Endocrine: Negative.   Genitourinary: Negative.  Negative for difficulty urinating, dysuria, frequency, hematuria and urgency.  Musculoskeletal: Negative.  Negative for arthralgias, back pain, joint swelling, myalgias and neck pain.  Skin: Negative.  Negative for rash and wound.  Allergic/Immunologic: Negative.  Negative for immunocompromised state.  Neurological: Negative.  Negative for dizziness, seizures, numbness and headaches.  Hematological: Negative.   Psychiatric/Behavioral: Negative.  Negative for behavioral problems, self-injury and suicidal ideas. The patient is not nervous/anxious.     Vital Signs: BP 126/70   Pulse 66   Temp 98.3 F (36.8 C)   Resp 16   Ht '5\' 2"'$  (1.575 m)   Wt 164 lb 12.8 oz (74.8 kg)   SpO2 99%   BMI 30.14 kg/m    Physical Exam Vitals reviewed.  Constitutional:      General: Theresa Yang is awake. Theresa Yang is not in acute distress.    Appearance: Normal appearance. Theresa Yang is well-developed, well-groomed and overweight. Theresa Yang is not ill-appearing or diaphoretic.  HENT:     Head: Normocephalic and atraumatic.     Right Ear: External ear normal. There is impacted cerumen.     Left Ear: External ear normal. There is impacted cerumen.     Nose: Nose normal. No congestion or rhinorrhea.     Mouth/Throat:     Lips: Pink.     Mouth: Mucous membranes are moist.     Pharynx: Oropharynx is clear. Uvula midline. No oropharyngeal exudate or posterior oropharyngeal erythema.  Eyes:     General: Lids are normal. Vision grossly intact. Gaze aligned appropriately. No scleral icterus.       Right eye: No discharge.        Left eye: No discharge.     Extraocular Movements: Extraocular movements intact.     Conjunctiva/sclera: Conjunctivae normal.     Pupils: Pupils are equal, round, and reactive to light.     Funduscopic exam:    Right eye: Red reflex present.        Left eye: Red reflex present. Neck:      Thyroid: No thyromegaly.     Vascular: No JVD.     Trachea: Trachea and phonation normal. No tracheal deviation.  Cardiovascular:     Rate and Rhythm: Normal rate and regular rhythm.     Pulses:          Carotid pulses are 3+ on the right side and 3+ on the left side.      Radial pulses are 2+ on the right side and 2+ on the left side.       Posterior tibial pulses are 2+ on the right side and 2+ on the left side.     Heart sounds: Normal heart sounds, S1 normal and S2 normal. No murmur heard.    No friction rub. No gallop.  Pulmonary:     Effort: Pulmonary effort is normal. No accessory muscle usage or respiratory distress.     Breath sounds: Normal breath  sounds and air entry. No stridor. No wheezing or rales.  Chest:     Chest wall: No tenderness.     Comments: Patient have mammogram ordered, will call to schedule. Does self breast exams at home. Declined clinical breast exam.  Abdominal:     General: Bowel sounds are normal. There is no distension.     Palpations: Abdomen is soft. There is no mass.     Tenderness: There is no abdominal tenderness. There is no guarding or rebound.  Musculoskeletal:        General: No tenderness or deformity. Normal range of motion.     Cervical back: Normal range of motion and neck supple.     Right lower leg: No edema.     Left lower leg: No edema.  Lymphadenopathy:     Cervical: No cervical adenopathy.  Skin:    General: Skin is warm and dry.     Capillary Refill: Capillary refill takes less than 2 seconds.     Coloration: Skin is not pale.     Findings: No erythema or rash.  Neurological:     Mental Status: Theresa Yang is alert and oriented to person, place, and time.     Cranial Nerves: No cranial nerve deficit.     Motor: No abnormal muscle tone.     Coordination: Coordination normal.     Gait: Gait normal.     Deep Tendon Reflexes: Reflexes are normal and symmetric.  Psychiatric:        Mood and Affect: Mood and affect normal.         Behavior: Behavior normal. Behavior is cooperative.        Thought Content: Thought content normal.        Judgment: Judgment normal.        Assessment/Plan: 1. Encounter for general adult medical examination with abnormal findings Age-appropriate preventive screenings and vaccinations discussed, annual physical exam completed. Routine labs for health maintenance deferred until march. PHM updated.   2. Dysuria Routine urinalysis done - UA/M w/rflx Culture, Routine - Microscopic Examination - Urine Culture, Reflex  3. Need for vaccination - Tdap ([place]) 5-2.5-18.5 LF-MCG/0.5 injection; Inject 0.5 mLs into the muscle once for 1 dose.  Dispense: 0.5 mL; Refill: 0 - Zoster Vaccine Adjuvanted [facility]) injection; Inject 0.5 mLs into the muscle once for 1 dose.  Dispense: 0.5 mL; Refill: 0     General Counseling: Theresa Yang verbalizes understanding of the findings of todays visit and agrees with plan of treatment. I have discussed any further diagnostic evaluation that may be needed or ordered today. We also reviewed Theresa Yang medications today. Theresa Yang has been encouraged to call the office with any questions or concerns that should arise related to todays visit.    Orders Placed This Encounter  Procedures   UA/M w/rflx Culture, Routine    Meds ordered this encounter  Medications   Tdap (BOOSTRIX) 5-2.5-18.5 LF-MCG/0.5 injection    Sig: Inject 0.5 mLs into the muscle once for 1 dose.    Dispense:  0.5 mL    Refill:  0   Zoster Vaccine Adjuvanted [facility]) injection    Sig: Inject 0.5 mLs into the muscle once for 1 dose.    Dispense:  0.5 mL    Refill:  0    Return in about 6 months (around 08/04/2022) for F/U, med refill, Theresa Yang PCP.   Total time spent:30 Minutes Time spent includes review of chart, medications, test results, and follow up plan with the patient.  [place] Controlled Substance Database was reviewed by me.  This patient was seen by Jonetta Osgood, FNP-C in  collaboration with Dr. Clayborn Bigness as a part of collaborative care agreement.  Shakema Surita R. Valetta Fuller, MSN, FNP-C Internal medicine

## 2022-02-08 LAB — UA/M W/RFLX CULTURE, ROUTINE
Bilirubin, UA: NEGATIVE
Glucose, UA: NEGATIVE
Ketones, UA: NEGATIVE
Nitrite, UA: NEGATIVE
Protein,UA: NEGATIVE
RBC, UA: NEGATIVE
Specific Gravity, UA: 1.006 (ref 1.005–1.030)
Urobilinogen, Ur: 0.2 mg/dL (ref 0.2–1.0)
pH, UA: 6.5 (ref 5.0–7.5)

## 2022-02-08 LAB — URINE CULTURE, REFLEX

## 2022-02-08 LAB — MICROSCOPIC EXAMINATION
Bacteria, UA: NONE SEEN
Casts: NONE SEEN /lpf
RBC, Urine: NONE SEEN /hpf (ref 0–2)
WBC, UA: NONE SEEN /hpf (ref 0–5)

## 2022-02-11 ENCOUNTER — Telehealth: Payer: Self-pay

## 2022-02-11 ENCOUNTER — Other Ambulatory Visit: Payer: Self-pay | Admitting: Nurse Practitioner

## 2022-02-11 DIAGNOSIS — N390 Urinary tract infection, site not specified: Secondary | ICD-10-CM

## 2022-02-11 MED ORDER — NITROFURANTOIN MONOHYD MACRO 100 MG PO CAPS: 100.0000 mg | ORAL_CAPSULE | Freq: Two times a day (BID) | ORAL | 0 refills | Status: AC

## 2022-02-11 NOTE — Telephone Encounter (Signed)
pt called and stated she was seen on Monday and she is having lower back pain and burning with urination.  her culture came back for pt to be treated.  Per Alyssa we sent in Nitrofurantoin 100 mg 2 x day for 7 days.  LMOM to notify pt of med sent to pharmacy.

## 2022-02-11 NOTE — Progress Notes (Signed)
Sx uti and culture is positive for ecoli, antibx prescribed. No allergies to any antibiotics, and sensitivity showed resistance to bactrim

## 2022-02-26 ENCOUNTER — Other Ambulatory Visit: Payer: Self-pay | Admitting: Nurse Practitioner

## 2022-02-26 DIAGNOSIS — B372 Candidiasis of skin and nail: Secondary | ICD-10-CM

## 2022-03-24 ENCOUNTER — Other Ambulatory Visit: Payer: Self-pay | Admitting: Nurse Practitioner

## 2022-03-24 DIAGNOSIS — Z1231 Encounter for screening mammogram for malignant neoplasm of breast: Secondary | ICD-10-CM

## 2022-03-24 DIAGNOSIS — I10 Essential (primary) hypertension: Secondary | ICD-10-CM | POA: Diagnosis not present

## 2022-03-24 DIAGNOSIS — I340 Nonrheumatic mitral (valve) insufficiency: Secondary | ICD-10-CM | POA: Diagnosis not present

## 2022-03-24 DIAGNOSIS — E782 Mixed hyperlipidemia: Secondary | ICD-10-CM | POA: Diagnosis not present

## 2022-06-14 ENCOUNTER — Other Ambulatory Visit: Payer: Self-pay | Admitting: Nurse Practitioner

## 2022-06-23 ENCOUNTER — Ambulatory Visit
Admission: RE | Admit: 2022-06-23 | Discharge: 2022-06-23 | Disposition: A | Discharge status: Home / Self Care | Payer: BC Managed Care – PPO | Source: Ambulatory Visit | Attending: Nurse Practitioner | Admitting: Nurse Practitioner

## 2022-06-23 DIAGNOSIS — Z1231 Encounter for screening mammogram for malignant neoplasm of breast: Secondary | ICD-10-CM | POA: Insufficient documentation

## 2022-08-04 ENCOUNTER — Ambulatory Visit: Payer: BC Managed Care – PPO | Admitting: Nurse Practitioner

## 2022-08-04 ENCOUNTER — Encounter: Payer: Self-pay | Admitting: Nurse Practitioner

## 2022-08-04 VITALS — BP 134/73 | HR 82 | Temp 98.4°F | Resp 16 | Ht 62.0 in | Wt 168.2 lb

## 2022-08-04 DIAGNOSIS — E039 Hypothyroidism, unspecified: Secondary | ICD-10-CM | POA: Diagnosis not present

## 2022-08-04 DIAGNOSIS — I10 Essential (primary) hypertension: Secondary | ICD-10-CM | POA: Diagnosis not present

## 2022-08-04 DIAGNOSIS — B372 Candidiasis of skin and nail: Secondary | ICD-10-CM | POA: Diagnosis not present

## 2022-08-04 DIAGNOSIS — Z23 Encounter for immunization: Secondary | ICD-10-CM | POA: Diagnosis not present

## 2022-08-04 MED ORDER — ZOSTER VAC RECOMB ADJUVANTED 50 MCG/0.5ML IM SUSR: 0.5000 mL | Freq: Once | INTRAMUSCULAR | 0 refills | Status: AC

## 2022-08-04 MED ORDER — LEVOTHYROXINE SODIUM 50 MCG PO TABS: 50.0000 ug | ORAL_TABLET | Freq: Every day | ORAL | 1 refills | Status: DC

## 2022-08-04 MED ORDER — NYSTATIN 100000 UNIT/GM EX OINT: 1.0000 | TOPICAL_OINTMENT | Freq: Two times a day (BID) | CUTANEOUS | 3 refills | Status: DC

## 2022-08-04 MED ORDER — TETANUS-DIPHTH-ACELL PERTUSSIS 5-2.5-18.5 LF-MCG/0.5 IM SUSP: 0.5000 mL | Freq: Once | INTRAMUSCULAR | 0 refills | Status: AC

## 2022-08-04 NOTE — Progress Notes (Signed)
[facility] [place], [place] [id number]  Internal MEDICINE  Office Visit Note  Patient Name: Theresa Yang  [id number]  [id number]  Date of Service: 08/04/2022  Chief Complaint  Patient presents with   Follow-up   Hypertension    HPI Theresa Yang presents for a follow-up visit for Hypertension --- controlled  Hypothyroidism -- taking levothyroxine Loose stools -- sometimes, small amount of leaking on underwear.  Due for pap smear, prefers to have it done with annual physical exam in September.    Current Medication: Outpatient Encounter Medications as of 08/04/2022  Medication Sig   clotrimazole-betamethasone (LOTRISONE) cream Apply 1 application topically 2 (two) times daily.   desonide (DESOWEN) 0.05 % cream Apply topically.   fluocinonide cream (LIDEX) 8.10 % Apply 1 application topically 2 (two) times daily.   nystatin ointment (MYCOSTATIN) Apply 1 Application topically 2 (two) times daily.   telmisartan-hydrochlorothiazide (MICARDIS HCT) 80-12.5 MG tablet 1 tablet daily.    [DISCONTINUED] levothyroxine (SYNTHROID) 50 MCG tablet TAKE 1 TABLET BY MOUTH ONCE DAILY BEFORE BREAKFAST   [DISCONTINUED] nystatin cream (MYCOSTATIN) Apply topically 3 (three) times daily.   [DISCONTINUED] Tdap (BOOSTRIX) 5-2.5-18.5 LF-MCG/0.5 injection Inject 0.5 mLs into the muscle once.   [DISCONTINUED] Zoster Vaccine Adjuvanted [facility]) injection Inject 0.5 mLs into the muscle once.   levothyroxine (SYNTHROID) 50 MCG tablet Take 1 tablet (50 mcg total) by mouth daily before breakfast.   [EXPIRED] Tdap (BOOSTRIX) 5-2.5-18.5 LF-MCG/0.5 injection Inject 0.5 mLs into the muscle once for 1 dose.   [EXPIRED] Zoster Vaccine Adjuvanted [facility]) injection Inject 0.5 mLs into the muscle once for 1 dose.   No facility-administered encounter medications on file as of 08/04/2022.    Surgical History: Past Surgical History:  Procedure Laterality Date   ABDOMINAL HYSTERECTOMY      COLONOSCOPY WITH PROPOFOL N/A 07/12/2018   Procedure: COLONOSCOPY WITH PROPOFOL;  Surgeon: Jonathon Bellows, MD;  Location: [facility] ENDOSCOPY;  Service: Gastroenterology;  Laterality: N/A;    Medical History: Past Medical History:  Diagnosis Date   Complication of anesthesia    Hypertension    Hypothyroidism    PONV (postoperative nausea and vomiting)    Thyroid disease     Family History: Family History  Problem Relation Age of Onset   Diabetes Mother    Hypothyroidism Father    Cancer Father    Breast cancer Neg Hx     Social History   Socioeconomic History   Marital status: Single    Spouse name: Not on file   Number of children: Not on file   Years of education: Not on file   Highest education level: Not on file  Occupational History   Not on file  Tobacco Use   Smoking status: Never   Smokeless tobacco: Never  Vaping Use   Vaping Use: Never used  Substance and Sexual Activity   Alcohol use: No   Drug use: No   Sexual activity: Not on file  Other Topics Concern   Not on file  Social History Narrative   Not on file   Social Determinants of Health   Financial Resource Strain: Not on file  Food Insecurity: Not on file  Transportation Needs: Not on file  Physical Activity: Not on file  Stress: Not on file  Social Connections: Not on file  Intimate Partner Violence: Not on file      Review of Systems  Constitutional:  Negative for chills, fatigue and unexpected weight change.  HENT:  Negative for  congestion, rhinorrhea, sneezing and sore throat.   Eyes:  Negative for redness.  Respiratory:  Negative for cough, chest tightness and shortness of breath.   Cardiovascular:  Negative for chest pain and palpitations.  Gastrointestinal:  Negative for abdominal pain, constipation, diarrhea, nausea and vomiting.  Genitourinary:  Negative for dysuria and frequency.  Musculoskeletal:  Negative for arthralgias, back pain, joint swelling and neck pain.  Skin:  Negative  for rash.  Neurological: Negative.  Negative for tremors and numbness.  Hematological:  Negative for adenopathy. Does not bruise/bleed easily.  Psychiatric/Behavioral:  Negative for behavioral problems (Depression), sleep disturbance and suicidal ideas. The patient is not nervous/anxious.     Vital Signs: BP 134/73   Pulse 82   Temp 98.4 F (36.9 C)   Resp 16   Ht 5\' 2"  (1.575 m)   Wt 168 lb 3.2 oz (76.3 kg)   SpO2 98%   BMI 30.76 kg/m    Physical Exam Vitals reviewed.  Constitutional:      General: She is not in acute distress.    Appearance: Normal appearance. She is not ill-appearing.  HENT:     Head: Normocephalic and atraumatic.  Eyes:     Pupils: Pupils are equal, round, and reactive to light.  Cardiovascular:     Rate and Rhythm: Normal rate and regular rhythm.  Pulmonary:     Effort: Pulmonary effort is normal. No respiratory distress.  Neurological:     Mental Status: She is alert and oriented to person, place, and time.     Cranial Nerves: No cranial nerve deficit.     Coordination: Coordination normal.     Gait: Gait normal.  Psychiatric:        Mood and Affect: Mood normal.        Behavior: Behavior normal.        Assessment/Plan: 1. Acquired hypothyroidism Continue levothyroxine as prescribed.  - levothyroxine (SYNTHROID) 50 MCG tablet; Take 1 tablet (50 mcg total) by mouth daily before breakfast.  Dispense: 90 tablet; Refill: 1  2. Essential hypertension Stable, continue medications as prescribed.   3. Cutaneous candidiasis Nystatin refills ordered - nystatin ointment (MYCOSTATIN); Apply 1 Application topically 2 (two) times daily.  Dispense: 30 g; Refill: 3  4. Need for vaccination - Tdap ([place]) 5-2.5-18.5 LF-MCG/0.5 injection; Inject 0.5 mLs into the muscle once for 1 dose.  Dispense: 0.5 mL; Refill: 0 - Zoster Vaccine Adjuvanted [facility]) injection; Inject 0.5 mLs into the muscle once for 1 dose.  Dispense: 0.5 mL; Refill:  0   General Counseling: Theresa Yang verbalizes understanding of the findings of todays visit and agrees with plan of treatment. I have discussed any further diagnostic evaluation that may be needed or ordered today. We also reviewed her medications today. she has been encouraged to call the office with any questions or concerns that should arise related to todays visit.    No orders of the defined types were placed in this encounter.   Meds ordered this encounter  Medications   Tdap (BOOSTRIX) 5-2.5-18.5 LF-MCG/0.5 injection    Sig: Inject 0.5 mLs into the muscle once for 1 dose.    Dispense:  0.5 mL    Refill:  0   Zoster Vaccine Adjuvanted [facility]) injection    Sig: Inject 0.5 mLs into the muscle once for 1 dose.    Dispense:  0.5 mL    Refill:  0   levothyroxine (SYNTHROID) 50 MCG tablet    Sig: Take 1 tablet (50 mcg total)  by mouth daily before breakfast.    Dispense:  90 tablet    Refill:  1   nystatin ointment (MYCOSTATIN)    Sig: Apply 1 Application topically 2 (two) times daily.    Dispense:  30 g    Refill:  3    Discontinue nystatin cream, fill new script for nystatin ointment instead    Return for previously scheduled, CPE, Nikia Levels PCP in september.   Total time spent:30 Minutes Time spent includes review of chart, medications, test results, and follow up plan with the patient.   [place] Controlled Substance Database was reviewed by me.  This patient was seen by Jonetta Osgood, FNP-C in collaboration with Dr. Clayborn Bigness as a part of collaborative care agreement.   Janiel Derhammer R. Valetta Fuller, MSN, FNP-C Internal medicine

## 2022-08-18 ENCOUNTER — Encounter: Payer: Self-pay | Admitting: Nurse Practitioner

## 2023-01-12 ENCOUNTER — Other Ambulatory Visit: Payer: Self-pay | Admitting: Nurse Practitioner

## 2023-01-12 DIAGNOSIS — E039 Hypothyroidism, unspecified: Secondary | ICD-10-CM | POA: Diagnosis not present

## 2023-01-12 DIAGNOSIS — E559 Vitamin D deficiency, unspecified: Secondary | ICD-10-CM | POA: Diagnosis not present

## 2023-01-12 DIAGNOSIS — I10 Essential (primary) hypertension: Secondary | ICD-10-CM | POA: Diagnosis not present

## 2023-01-12 DIAGNOSIS — E782 Mixed hyperlipidemia: Secondary | ICD-10-CM | POA: Diagnosis not present

## 2023-01-12 DIAGNOSIS — Z0001 Encounter for general adult medical examination with abnormal findings: Secondary | ICD-10-CM | POA: Diagnosis not present

## 2023-01-13 LAB — CBC WITH DIFFERENTIAL/PLATELET
Basophils Absolute: 0.1 10*3/uL (ref 0.0–0.2)
Basos: 1 %
EOS (ABSOLUTE): 0.3 10*3/uL (ref 0.0–0.4)
Eos: 6 %
Hematocrit: 41.2 % (ref 34.0–46.6)
Hemoglobin: 13.7 g/dL (ref 11.1–15.9)
Immature Grans (Abs): 0 10*3/uL (ref 0.0–0.1)
Immature Granulocytes: 0 %
Lymphocytes Absolute: 1.3 10*3/uL (ref 0.7–3.1)
Lymphs: 28 %
MCH: 31.8 pg (ref 26.6–33.0)
MCHC: 33.3 g/dL (ref 31.5–35.7)
MCV: 96 fL (ref 79–97)
Monocytes Absolute: 0.5 10*3/uL (ref 0.1–0.9)
Monocytes: 10 %
Neutrophils Absolute: 2.4 10*3/uL (ref 1.4–7.0)
Neutrophils: 55 %
Platelets: 191 10*3/uL (ref 150–450)
RBC: 4.31 x10E6/uL (ref 3.77–5.28)
RDW: 11.7 % (ref 11.7–15.4)
WBC: 4.5 10*3/uL (ref 3.4–10.8)

## 2023-01-13 LAB — COMPREHENSIVE METABOLIC PANEL
ALT: 17 IU/L (ref 0–32)
AST: 21 IU/L (ref 0–40)
Albumin: 4.3 g/dL (ref 3.8–4.9)
Alkaline Phosphatase: 64 IU/L (ref 44–121)
BUN/Creatinine Ratio: 14 (ref 9–23)
BUN: 14 mg/dL (ref 6–24)
Bilirubin Total: 0.4 mg/dL (ref 0.0–1.2)
CO2: 24 mmol/L (ref 20–29)
Calcium: 9.7 mg/dL (ref 8.7–10.2)
Chloride: 100 mmol/L (ref 96–106)
Creatinine, Ser: 0.97 mg/dL (ref 0.57–1.00)
Globulin, Total: 2.6 g/dL (ref 1.5–4.5)
Glucose: 99 mg/dL (ref 70–99)
Potassium: 4.9 mmol/L (ref 3.5–5.2)
Sodium: 136 mmol/L (ref 134–144)
Total Protein: 6.9 g/dL (ref 6.0–8.5)
eGFR: 69 mL/min/{1.73_m2} (ref >59)

## 2023-01-13 LAB — LIPID PANEL
Chol/HDL Ratio: 3.4 ratio (ref 0.0–4.4)
Cholesterol, Total: 167 mg/dL (ref 100–199)
HDL: 49 mg/dL (ref >39)
LDL Chol Calc (NIH): 109 mg/dL — ABNORMAL HIGH (ref 0–99)
Triglycerides: 43 mg/dL (ref 0–149)
VLDL Cholesterol Cal: 9 mg/dL (ref 5–40)

## 2023-01-13 LAB — TSH: TSH: 3.49 u[IU]/mL (ref 0.450–4.500)

## 2023-01-13 LAB — T4, FREE: Free T4: 1.47 ng/dL (ref 0.82–1.77)

## 2023-01-13 LAB — VITAMIN D 25 HYDROXY (VIT D DEFICIENCY, FRACTURES): Vit D, 25-Hydroxy: 33.4 ng/mL (ref 30.0–100.0)

## 2023-02-09 ENCOUNTER — Ambulatory Visit (INDEPENDENT_AMBULATORY_CARE_PROVIDER_SITE_OTHER): Payer: BC Managed Care – PPO | Admitting: Nurse Practitioner

## 2023-02-09 ENCOUNTER — Encounter: Payer: Self-pay | Admitting: Nurse Practitioner

## 2023-02-09 VITALS — BP 134/88 | HR 69 | Temp 98.4°F | Resp 16 | Ht 62.0 in | Wt 161.0 lb

## 2023-02-09 DIAGNOSIS — Z0001 Encounter for general adult medical examination with abnormal findings: Secondary | ICD-10-CM

## 2023-02-09 DIAGNOSIS — I10 Essential (primary) hypertension: Secondary | ICD-10-CM | POA: Diagnosis not present

## 2023-02-09 DIAGNOSIS — B372 Candidiasis of skin and nail: Secondary | ICD-10-CM

## 2023-02-09 DIAGNOSIS — E039 Hypothyroidism, unspecified: Secondary | ICD-10-CM

## 2023-02-09 MED ORDER — TELMISARTAN-HCTZ 80-12.5 MG PO TABS: 1.0000 | ORAL_TABLET | Freq: Every day | ORAL | 3 refills | Status: DC

## 2023-02-09 MED ORDER — LEVOTHYROXINE SODIUM 50 MCG PO TABS: 50.0000 ug | ORAL_TABLET | Freq: Every day | ORAL | 1 refills | Status: DC

## 2023-02-09 MED ORDER — NYSTATIN 100000 UNIT/GM EX CREA: 1.0000 | TOPICAL_CREAM | Freq: Two times a day (BID) | CUTANEOUS | 4 refills | Status: AC

## 2023-02-09 NOTE — Progress Notes (Signed)
[facility] [address] [place], [place] [address]  Internal MEDICINE  Office Visit Note  Patient Name: Theresa Yang  [id number]  [id number]  Date of Service: 02/09/2023  Chief Complaint  Patient presents with   Hypertension   Annual Exam    HPI Cia presents for an annual well visit and physical exam.  Well-appearing 57 y.o. female with hypothyroidism, hypertension and a heart murmur.  Routine CRC screening: due in February 2025  Routine mammogram: done in January this year  Pap smear: due now, will schedule follow up  Labs: all labs were normal except for elevated LDL at 109. New or worsening pain:none  Other concerns:none     Current Medication: Outpatient Encounter Medications as of 02/09/2023  Medication Sig   clotrimazole-betamethasone (LOTRISONE) cream Apply 1 application topically 2 (two) times daily.   desonide (DESOWEN) 0.05 % cream Apply topically.   fluocinonide cream (LIDEX) 0.05 % Apply 1 application topically 2 (two) times daily.   nystatin cream (MYCOSTATIN) Apply 1 Application topically 2 (two) times daily. To affected area until resolved.   [DISCONTINUED] levothyroxine (SYNTHROID) 50 MCG tablet Take 1 tablet (50 mcg total) by mouth daily before breakfast.   [DISCONTINUED] nystatin ointment (MYCOSTATIN) Apply 1 Application topically 2 (two) times daily.   [DISCONTINUED] telmisartan-hydrochlorothiazide (MICARDIS HCT) 80-12.5 MG tablet 1 tablet daily.    levothyroxine (SYNTHROID) 50 MCG tablet Take 1 tablet (50 mcg total) by mouth daily before breakfast.   telmisartan-hydrochlorothiazide (MICARDIS HCT) 80-12.5 MG tablet Take 1 tablet by mouth daily.   No facility-administered encounter medications on file as of 02/09/2023.    Surgical History: Past Surgical History:  Procedure Laterality Date   ABDOMINAL HYSTERECTOMY     COLONOSCOPY WITH PROPOFOL N/A 07/12/2018   Procedure: COLONOSCOPY WITH PROPOFOL;  Surgeon: Wyline Mood, MD;   Location: [facility] ENDOSCOPY;  Service: Gastroenterology;  Laterality: N/A;    Medical History: Past Medical History:  Diagnosis Date   Complication of anesthesia    Hypertension    Hypothyroidism    PONV (postoperative nausea and vomiting)    Thyroid disease     Family History: Family History  Problem Relation Age of Onset   Diabetes Mother    Hypothyroidism Father    Cancer Father    Breast cancer Neg Hx     Social History   Socioeconomic History   Marital status: Single    Spouse name: Not on file   Number of children: Not on file   Years of education: Not on file   Highest education level: Not on file  Occupational History   Not on file  Tobacco Use   Smoking status: Never   Smokeless tobacco: Never  Vaping Use   Vaping status: Never Used  Substance and Sexual Activity   Alcohol use: No   Drug use: No   Sexual activity: Not on file  Other Topics Concern   Not on file  Social History Narrative   Not on file   Social Determinants of Health   Financial Resource Strain: Not on file  Food Insecurity: Not on file  Transportation Needs: Not on file  Physical Activity: Not on file  Stress: Not on file  Social Connections: Not on file  Intimate Partner Violence: Not on file      Review of Systems  Constitutional:  Negative for activity change, appetite change, chills, fatigue, fever and unexpected weight change.  HENT: Negative.  Negative for congestion, ear pain, rhinorrhea, sore throat and trouble swallowing.  Eyes: Negative.   Respiratory: Negative.  Negative for cough, chest tightness, shortness of breath and wheezing.   Cardiovascular: Negative.  Negative for chest pain.  Gastrointestinal: Negative.  Negative for abdominal pain, blood in stool, constipation, diarrhea, nausea and vomiting.  Endocrine: Negative.   Genitourinary: Negative.  Negative for difficulty urinating, dysuria, frequency, hematuria and urgency.  Musculoskeletal: Negative.  Negative  for arthralgias, back pain, joint swelling, myalgias and neck pain.  Skin: Negative.  Negative for rash and wound.  Allergic/Immunologic: Negative.  Negative for immunocompromised state.  Neurological: Negative.  Negative for dizziness, seizures, numbness and headaches.  Hematological: Negative.   Psychiatric/Behavioral: Negative.  Negative for behavioral problems, self-injury and suicidal ideas. The patient is not nervous/anxious.     Vital Signs: BP 134/88   Pulse 69   Temp 98.4 F (36.9 C)   Resp 16   Ht 5\' 2"  (1.575 m)   Wt 161 lb (73 kg)   SpO2 96%   BMI 29.45 kg/m    Physical Exam Vitals reviewed.  Constitutional:      General: She is awake. She is not in acute distress.    Appearance: Normal appearance. She is well-developed, well-groomed and overweight. She is not ill-appearing or diaphoretic.  HENT:     Head: Normocephalic and atraumatic.     Right Ear: Tympanic membrane and external ear normal. There is no impacted cerumen.     Left Ear: Tympanic membrane, ear canal and external ear normal. There is no impacted cerumen.     Nose: Nose normal. No congestion or rhinorrhea.     Mouth/Throat:     Lips: Pink.     Mouth: Mucous membranes are moist.     Pharynx: Oropharynx is clear. Uvula midline. No oropharyngeal exudate or posterior oropharyngeal erythema.  Eyes:     General: Lids are normal. Vision grossly intact. Gaze aligned appropriately. No scleral icterus.       Right eye: No discharge.        Left eye: No discharge.     Extraocular Movements: Extraocular movements intact.     Conjunctiva/sclera: Conjunctivae normal.     Pupils: Pupils are equal, round, and reactive to light.     Funduscopic exam:    Right eye: Red reflex present.        Left eye: Red reflex present. Neck:     Thyroid: No thyromegaly.     Vascular: No JVD.     Trachea: Trachea and phonation normal. No tracheal deviation.  Cardiovascular:     Rate and Rhythm: Normal rate and regular rhythm.      Pulses:          Carotid pulses are 3+ on the right side and 3+ on the left side.      Radial pulses are 2+ on the right side and 2+ on the left side.       Posterior tibial pulses are 2+ on the right side and 2+ on the left side.     Heart sounds: Normal heart sounds, S1 normal and S2 normal. No murmur heard.    No friction rub. No gallop.  Pulmonary:     Effort: Pulmonary effort is normal. No accessory muscle usage or respiratory distress.     Breath sounds: Normal breath sounds and air entry. No stridor. No wheezing or rales.  Chest:     Chest wall: No tenderness.     Comments: Patient have mammogram ordered, will call to schedule. Does self breast exams at  home. Declined clinical breast exam.  Abdominal:     General: Bowel sounds are normal. There is no distension.     Palpations: Abdomen is soft. There is no mass.     Tenderness: There is no abdominal tenderness. There is no guarding or rebound.  Musculoskeletal:        General: No tenderness or deformity. Normal range of motion.     Cervical back: Normal range of motion and neck supple.     Right lower leg: No edema.     Left lower leg: No edema.  Lymphadenopathy:     Cervical: No cervical adenopathy.  Skin:    General: Skin is warm and dry.     Capillary Refill: Capillary refill takes less than 2 seconds.     Coloration: Skin is not pale.     Findings: No erythema or rash.  Neurological:     Mental Status: She is alert and oriented to person, place, and time.     Cranial Nerves: No cranial nerve deficit.     Motor: No abnormal muscle tone.     Coordination: Coordination normal.     Gait: Gait normal.     Deep Tendon Reflexes: Reflexes are normal and symmetric.  Psychiatric:        Mood and Affect: Mood and affect normal.        Behavior: Behavior normal. Behavior is cooperative.        Thought Content: Thought content normal.        Judgment: Judgment normal.        Assessment/Plan: 1. Encounter for routine  adult health examination with abnormal findings Age-appropriate preventive screenings and vaccinations discussed, annual physical exam completed. Routine labs for health maintenance results discussed with the patient today. PHM updated.   2. Acquired hypothyroidism Continue levothyroxine as prescribed - levothyroxine (SYNTHROID) 50 MCG tablet; Take 1 tablet (50 mcg total) by mouth daily before breakfast.  Dispense: 90 tablet; Refill: 1  3. Essential hypertension Continue micardis as prescribed.  - telmisartan-hydrochlorothiazide (MICARDIS HCT) 80-12.5 MG tablet; Take 1 tablet by mouth daily.  Dispense: 90 tablet; Refill: 3  4. Cutaneous candidiasis Use nystatin cream sparingly.  - nystatin cream (MYCOSTATIN); Apply 1 Application topically 2 (two) times daily. To affected area until resolved.  Dispense: 30 g; Refill: 4      General Counseling: Shenee verbalizes understanding of the findings of todays visit and agrees with plan of treatment. I have discussed any further diagnostic evaluation that may be needed or ordered today. We also reviewed her medications today. she has been encouraged to call the office with any questions or concerns that should arise related to todays visit.    No orders of the defined types were placed in this encounter.   Meds ordered this encounter  Medications   nystatin cream (MYCOSTATIN)    Sig: Apply 1 Application topically 2 (two) times daily. To affected area until resolved.    Dispense:  30 g    Refill:  4    Discontinue nystatin ointment and fill new script for nystatin cream   telmisartan-hydrochlorothiazide (MICARDIS HCT) 80-12.5 MG tablet    Sig: Take 1 tablet by mouth daily.    Dispense:  90 tablet    Refill:  3    For future refills, keep on file   levothyroxine (SYNTHROID) 50 MCG tablet    Sig: Take 1 tablet (50 mcg total) by mouth daily before breakfast.    Dispense:  90 tablet  Refill:  1    Return in about 6 months (around  08/09/2023) for F/U, Levert Heslop PCP.   Total time spent:30 Minutes Time spent includes review of chart, medications, test results, and follow up plan with the patient.   [place] Controlled Substance Database was reviewed by me.  This patient was seen by Sallyanne Kuster, FNP-C in collaboration with Dr. Beverely Risen as a part of collaborative care agreement.  Maverick Patman R. Tedd Sias, MSN, FNP-C Internal medicine

## 2023-05-13 ENCOUNTER — Other Ambulatory Visit: Payer: Self-pay

## 2023-06-22 ENCOUNTER — Other Ambulatory Visit: Payer: Self-pay | Admitting: Nurse Practitioner

## 2023-06-22 DIAGNOSIS — Z1231 Encounter for screening mammogram for malignant neoplasm of breast: Secondary | ICD-10-CM

## 2023-06-29 ENCOUNTER — Ambulatory Visit
Admission: RE | Admit: 2023-06-29 | Discharge: 2023-06-29 | Disposition: A | Discharge status: Home / Self Care | Payer: BC Managed Care – PPO | Source: Ambulatory Visit | Attending: Nurse Practitioner | Admitting: Nurse Practitioner

## 2023-06-29 DIAGNOSIS — Z1231 Encounter for screening mammogram for malignant neoplasm of breast: Secondary | ICD-10-CM | POA: Diagnosis present

## 2023-08-10 ENCOUNTER — Ambulatory Visit (INDEPENDENT_AMBULATORY_CARE_PROVIDER_SITE_OTHER): Payer: BC Managed Care – PPO | Admitting: Nurse Practitioner

## 2023-08-10 ENCOUNTER — Encounter: Payer: Self-pay | Admitting: Nurse Practitioner

## 2023-08-10 VITALS — BP 130/74 | HR 63 | Temp 98.2°F | Resp 16 | Ht 62.0 in | Wt 163.2 lb

## 2023-08-10 DIAGNOSIS — E039 Hypothyroidism, unspecified: Secondary | ICD-10-CM

## 2023-08-10 DIAGNOSIS — E559 Vitamin D deficiency, unspecified: Secondary | ICD-10-CM

## 2023-08-10 DIAGNOSIS — B372 Candidiasis of skin and nail: Secondary | ICD-10-CM

## 2023-08-10 DIAGNOSIS — R21 Rash and other nonspecific skin eruption: Secondary | ICD-10-CM

## 2023-08-10 DIAGNOSIS — I10 Essential (primary) hypertension: Secondary | ICD-10-CM

## 2023-08-10 MED ORDER — CLOTRIMAZOLE-BETAMETHASONE 1-0.05 % EX CREA: 1.0000 | TOPICAL_CREAM | Freq: Two times a day (BID) | CUTANEOUS | 2 refills | Status: DC

## 2023-08-10 MED ORDER — LEVOTHYROXINE SODIUM 50 MCG PO TABS: 50.0000 ug | ORAL_TABLET | Freq: Every day | ORAL | 1 refills | Status: DC

## 2023-08-10 NOTE — Progress Notes (Signed)
 [facility] [address] [place], [place] [address]  Internal MEDICINE  Office Visit Note  Patient Name: Theresa Yang  [id number]  [id number]  Date of Service: 08/10/2023  Chief Complaint  Patient presents with   Hypertension   Follow-up    HPI [name] presents for a follow-up visit for hypertension, high cholesterol, hypothyroidism, mild rash around  mouth on face.  Hypertension -- controlled with telmisartan-hydrochlorothiazide.  Hypothyroidism -- taking levothyroxine High cholesterol -- not currently on any medication, lab was only mildly elevated.  Rash around mouth -- does not wear makeup, lotion and has not tried putting any Otc products on the area. It is a mild dry itchy rash     Current Medication: Outpatient Encounter Medications as of 08/10/2023  Medication Sig   desonide (DESOWEN) 0.05 % cream Apply topically.   fluocinonide cream (LIDEX) 0.05 % Apply 1 application topically 2 (two) times daily.   nystatin cream (MYCOSTATIN) Apply 1 Application topically 2 (two) times daily. To affected area until resolved.   telmisartan-hydrochlorothiazide (MICARDIS HCT) 80-12.5 MG tablet Take 1 tablet by mouth daily.   [DISCONTINUED] clotrimazole-betamethasone (LOTRISONE) cream Apply 1 application topically 2 (two) times daily.   [DISCONTINUED] levothyroxine (SYNTHROID) 50 MCG tablet Take 1 tablet (50 mcg total) by mouth daily before breakfast.   clotrimazole-betamethasone (LOTRISONE) cream Apply 1 Application topically 2 (two) times daily.   levothyroxine (SYNTHROID) 50 MCG tablet Take 1 tablet (50 mcg total) by mouth daily before breakfast.   No facility-administered encounter medications on file as of 08/10/2023.    Surgical History: Past Surgical History:  Procedure Laterality Date   ABDOMINAL HYSTERECTOMY     COLONOSCOPY WITH PROPOFOL N/A 07/12/2018   Procedure: COLONOSCOPY WITH PROPOFOL;  Surgeon: Wyline Mood, MD;  Location: [facility] ENDOSCOPY;  Service:  Gastroenterology;  Laterality: N/A;    Medical History: Past Medical History:  Diagnosis Date   Complication of anesthesia    Hypertension    Hypothyroidism    PONV (postoperative nausea and vomiting)    Thyroid disease     Family History: Family History  Problem Relation Age of Onset   Diabetes Mother    Hypothyroidism Father    Cancer Father    Breast cancer Neg Hx     Social History   Socioeconomic History   Marital status: Single    Spouse name: Not on file   Number of children: Not on file   Years of education: Not on file   Highest education level: Not on file  Occupational History   Not on file  Tobacco Use   Smoking status: Never   Smokeless tobacco: Never  Vaping Use   Vaping status: Never Used  Substance and Sexual Activity   Alcohol use: No   Drug use: No   Sexual activity: Not on file  Other Topics Concern   Not on file  Social History Narrative   Not on file   Social Drivers of Health   Financial Resource Strain: Not on file  Food Insecurity: Not on file  Transportation Needs: Not on file  Physical Activity: Not on file  Stress: Not on file  Social Connections: Not on file  Intimate Partner Violence: Not on file      Review of Systems  Constitutional:  Negative for chills, fatigue and unexpected weight change.  HENT:  Negative for congestion, rhinorrhea, sneezing and sore throat.   Eyes:  Negative for redness.  Respiratory:  Negative for cough, chest tightness and shortness of breath.  Cardiovascular:  Negative for chest pain and palpitations.  Gastrointestinal:  Negative for abdominal pain, constipation, diarrhea, nausea and vomiting.  Genitourinary:  Negative for dysuria and frequency.  Musculoskeletal:  Negative for arthralgias, back pain, joint swelling and neck pain.  Skin:  Negative for rash.  Neurological: Negative.  Negative for tremors and numbness.  Hematological:  Negative for adenopathy. Does not bruise/bleed easily.   Psychiatric/Behavioral:  Negative for behavioral problems (Depression), sleep disturbance and suicidal ideas. The patient is not nervous/anxious.     Vital Signs: BP 130/74   Pulse 63   Temp 98.2 F (36.8 C)   Resp 16   Ht 5\' 2"  (1.575 m)   Wt 163 lb 3.2 oz (74 kg)   SpO2 95%   BMI 29.85 kg/m    Physical Exam Vitals reviewed.  Constitutional:      General: She is not in acute distress.    Appearance: Normal appearance. She is not ill-appearing.  HENT:     Head: Normocephalic and atraumatic.  Eyes:     Pupils: Pupils are equal, round, and reactive to light.  Cardiovascular:     Rate and Rhythm: Normal rate and regular rhythm.  Pulmonary:     Effort: Pulmonary effort is normal. No respiratory distress.  Skin:    Comments: Mild rash on face around her mouth, dry scaly and itchy  Neurological:     Mental Status: She is alert and oriented to person, place, and time.     Cranial Nerves: No cranial nerve deficit.     Coordination: Coordination normal.     Gait: Gait normal.  Psychiatric:        Mood and Affect: Mood normal.        Behavior: Behavior normal.        Assessment/Plan: 1. Essential hypertension (Primary) Stable, continue telmisartan-hydrochlorothiazide as prescribed and routine labs ordered for September visit. - CBC with Differential/Platelet - CMP14+EGFR - Lipid Profile - TSH + free T4  2. Acquired hypothyroidism Continue levothyroxine as prescribed. Routine labs ordered for her to have done before her visit in September. - levothyroxine (SYNTHROID) 50 MCG tablet; Take 1 tablet (50 mcg total) by mouth daily before breakfast.  Dispense: 90 tablet; Refill: 1 - CBC with Differential/Platelet - CMP14+EGFR - Lipid Profile - TSH + free T4  3. Vitamin D deficiency Routine labs ordered  - Vitamin D (25 hydroxy)  4. Rash of face Instructed patient to try OTC hydrocortisone cream   5. Cutaneous candidiasis Prn use of lotrisone, refills ordered -  clotrimazole-betamethasone (LOTRISONE) cream; Apply 1 Application topically 2 (two) times daily.  Dispense: 45 g; Refill: 2   General Counseling: Marja verbalizes understanding of the findings of todays visit and agrees with plan of treatment. I have discussed any further diagnostic evaluation that may be needed or ordered today. We also reviewed her medications today. she has been encouraged to call the office with any questions or concerns that should arise related to todays visit.    Orders Placed This Encounter  Procedures   CBC with Differential/Platelet   CMP14+EGFR   Lipid Profile   TSH + free T4   Vitamin D (25 hydroxy)    Meds ordered this encounter  Medications   levothyroxine (SYNTHROID) 50 MCG tablet    Sig: Take 1 tablet (50 mcg total) by mouth daily before breakfast.    Dispense:  90 tablet    Refill:  1   clotrimazole-betamethasone (LOTRISONE) cream    Sig: Apply 1 Application  topically 2 (two) times daily.    Dispense:  45 g    Refill:  2    For future refills    Return for previously scheduled, CPE, Rosie Torrez PCP in september this year .   Total time spent:30 Minutes Time spent includes review of chart, medications, test results, and follow up plan with the patient.   [place] Controlled Substance Database was reviewed by me.  This patient was seen by Sallyanne Kuster, FNP-C in collaboration with Dr. Beverely Risen as a part of collaborative care agreement.   Selwyn Reason R. Tedd Sias, MSN, FNP-C Internal medicine

## 2023-08-28 ENCOUNTER — Other Ambulatory Visit: Payer: Self-pay | Admitting: Nurse Practitioner

## 2023-08-28 DIAGNOSIS — E039 Hypothyroidism, unspecified: Secondary | ICD-10-CM

## 2023-08-30 ENCOUNTER — Other Ambulatory Visit: Payer: Self-pay | Admitting: Nurse Practitioner

## 2023-08-30 DIAGNOSIS — E039 Hypothyroidism, unspecified: Secondary | ICD-10-CM

## 2023-09-28 ENCOUNTER — Encounter: Payer: Self-pay | Admitting: Nurse Practitioner

## 2023-09-28 ENCOUNTER — Ambulatory Visit (INDEPENDENT_AMBULATORY_CARE_PROVIDER_SITE_OTHER): Admitting: Nurse Practitioner

## 2023-09-28 VITALS — BP 128/76 | HR 73 | Temp 98.7°F | Resp 16 | Ht 62.0 in | Wt 159.6 lb

## 2023-09-28 DIAGNOSIS — H6123 Impacted cerumen, bilateral: Secondary | ICD-10-CM | POA: Diagnosis not present

## 2023-09-28 NOTE — Progress Notes (Signed)
 [facility] [address] [place], [place] [address]  Internal MEDICINE  Office Visit Note  Patient Name: Theresa Yang  [id number]  [id number]  Date of Service: 09/28/2023  Chief Complaint  Patient presents with   Follow-up    HPI Theresa Yang presents for a follow-up visit for wax buildup in both ears Ears are stopped up, Has previous had ear lavage in 2022 and 2023 and did fine Denies having any dizziness or issues with ear lavage in the past.      Current Medication: Outpatient Encounter Medications as of 09/28/2023  Medication Sig   clotrimazole -betamethasone  (LOTRISONE ) cream Apply 1 Application topically 2 (two) times daily.   desonide (DESOWEN) 0.05 % cream Apply topically.   fluocinonide cream (LIDEX) 0.05 % Apply 1 application topically 2 (two) times daily.   levothyroxine  (SYNTHROID ) 50 MCG tablet TAKE 1 TABLET BY MOUTH ONCE DAILY BEFORE BREAKFAST   nystatin  cream (MYCOSTATIN ) Apply 1 Application topically 2 (two) times daily. To affected area until resolved.   telmisartan -hydrochlorothiazide (MICARDIS  HCT) 80-12.5 MG tablet Take 1 tablet by mouth daily.   No facility-administered encounter medications on file as of 09/28/2023.    Surgical History: Past Surgical History:  Procedure Laterality Date   ABDOMINAL HYSTERECTOMY     COLONOSCOPY WITH PROPOFOL  N/A 07/12/2018   Procedure: COLONOSCOPY WITH PROPOFOL ;  Surgeon: Luke Salaam, MD;  Location: [facility] ENDOSCOPY;  Service: Gastroenterology;  Laterality: N/A;    Medical History: Past Medical History:  Diagnosis Date   Complication of anesthesia    Hypertension    Hypothyroidism    PONV (postoperative nausea and vomiting)    Thyroid disease     Family History: Family History  Problem Relation Age of Onset   Diabetes Mother    Hypothyroidism Father    Cancer Father    Breast cancer Neg Hx     Social History   Socioeconomic History   Marital status: Single    Spouse name: Not on file    Number of children: Not on file   Years of education: Not on file   Highest education level: Not on file  Occupational History   Not on file  Tobacco Use   Smoking status: Never   Smokeless tobacco: Never  Vaping Use   Vaping status: Never Used  Substance and Sexual Activity   Alcohol use: No   Drug use: No   Sexual activity: Not on file  Other Topics Concern   Not on file  Social History Narrative   Not on file   Social Drivers of Health   Financial Resource Strain: Not on file  Food Insecurity: Not on file  Transportation Needs: Not on file  Physical Activity: Not on file  Stress: Not on file  Social Connections: Not on file  Intimate Partner Violence: Not on file      Review of Systems  HENT:  Positive for ear pain and hearing loss.     Vital Signs: BP 128/76   Pulse 73   Temp 98.7 F (37.1 C)   Resp 16   Ht 5\' 2"  (1.575 m)   Wt 159 lb 9.6 oz (72.4 kg)   SpO2 98%   BMI 29.19 kg/m    Physical Exam Vitals reviewed.  Constitutional:      General: She is not in acute distress.    Appearance: Normal appearance. She is not ill-appearing.  HENT:     Head: Normocephalic and atraumatic.     Right Ear: There is impacted  cerumen.     Left Ear: There is impacted cerumen.  Cardiovascular:     Rate and Rhythm: Normal rate and regular rhythm.  Pulmonary:     Effort: Pulmonary effort is normal. No respiratory distress.  Neurological:     Mental Status: She is alert and oriented to person, place, and time.  Psychiatric:        Mood and Affect: Mood normal.        Behavior: Behavior normal.        Assessment/Plan: 1. Bilateral impacted cerumen (Primary) Ear lavage attempted but the patient became very dizzy right away. Referred to ENT for ear wax removal.  - Ear Lavage - Ambulatory referral to ENT   General Counseling: Theresa Yang understanding of the findings of todays visit and agrees with plan of treatment. I have discussed any further  diagnostic evaluation that may be needed or ordered today. We also reviewed her medications today. she has been encouraged to call the office with any questions or concerns that should arise related to todays visit.    Orders Placed This Encounter  Procedures   Ambulatory referral to ENT   Ear Lavage    No orders of the defined types were placed in this encounter.   Return if symptoms worsen or fail to improve, for referred to ENT.   Total time spent:30 Minutes Time spent includes review of chart, medications, test results, and follow up plan with the patient.   [place] Controlled Substance Database was reviewed by me.  This patient was seen by Laurence Pons, FNP-C in collaboration with Dr. Verneta Gone as a part of collaborative care agreement.   Nikolos Billig R. Bobbi Burow, MSN, FNP-C Internal medicine

## 2023-10-10 ENCOUNTER — Telehealth: Payer: Self-pay | Admitting: Nurse Practitioner

## 2023-10-10 NOTE — Telephone Encounter (Signed)
 Otolaryngology referral sent via [facility] to [facility].  Lvm notifying patient. Gave pt telephone# [phone]-Toni

## 2023-10-12 ENCOUNTER — Telehealth: Payer: Self-pay | Admitting: Nurse Practitioner

## 2023-10-12 NOTE — Telephone Encounter (Signed)
 Otolaryngology appointment 10/26/2023 @ [facility]-Toni

## 2024-02-08 ENCOUNTER — Telehealth: Payer: Self-pay | Admitting: Nurse Practitioner

## 2024-02-08 NOTE — Telephone Encounter (Signed)
 Left vm and sent mychart message to confirm 02/15/24 appointment-Toni

## 2024-02-09 ENCOUNTER — Ambulatory Visit: Payer: Self-pay | Admitting: Nurse Practitioner

## 2024-02-09 LAB — CMP14+EGFR
ALT: 15 IU/L (ref 0–32)
AST: 17 IU/L (ref 0–40)
Albumin: 4.5 g/dL (ref 3.8–4.9)
Alkaline Phosphatase: 61 IU/L (ref 44–121)
BUN/Creatinine Ratio: 18 (ref 9–23)
BUN: 16 mg/dL (ref 6–24)
Bilirubin Total: 0.5 mg/dL (ref 0.0–1.2)
CO2: 20 mmol/L (ref 20–29)
Calcium: 9.3 mg/dL (ref 8.7–10.2)
Chloride: 100 mmol/L (ref 96–106)
Creatinine, Ser: 0.9 mg/dL (ref 0.57–1.00)
Globulin, Total: 2.2 g/dL (ref 1.5–4.5)
Glucose: 97 mg/dL (ref 70–99)
Potassium: 4.6 mmol/L (ref 3.5–5.2)
Sodium: 138 mmol/L (ref 134–144)
Total Protein: 6.7 g/dL (ref 6.0–8.5)
eGFR: 75 mL/min/1.73 (ref >59)

## 2024-02-09 LAB — CBC WITH DIFFERENTIAL/PLATELET
Basophils Absolute: 0.1 x10E3/uL (ref 0.0–0.2)
Basos: 1 %
EOS (ABSOLUTE): 0.2 x10E3/uL (ref 0.0–0.4)
Eos: 5 %
Hematocrit: 40.7 % (ref 34.0–46.6)
Hemoglobin: 13.7 g/dL (ref 11.1–15.9)
Immature Grans (Abs): 0 x10E3/uL (ref 0.0–0.1)
Immature Granulocytes: 0 %
Lymphocytes Absolute: 1.5 x10E3/uL (ref 0.7–3.1)
Lymphs: 30 %
MCH: 32.7 pg (ref 26.6–33.0)
MCHC: 33.7 g/dL (ref 31.5–35.7)
MCV: 97 fL (ref 79–97)
Monocytes Absolute: 0.5 x10E3/uL (ref 0.1–0.9)
Monocytes: 10 %
Neutrophils Absolute: 2.6 x10E3/uL (ref 1.4–7.0)
Neutrophils: 54 %
Platelets: 205 x10E3/uL (ref 150–450)
RBC: 4.19 x10E6/uL (ref 3.77–5.28)
RDW: 11.8 % (ref 11.7–15.4)
WBC: 4.9 x10E3/uL (ref 3.4–10.8)

## 2024-02-09 LAB — LIPID PANEL
Chol/HDL Ratio: 3.6 ratio (ref 0.0–4.4)
Cholesterol, Total: 189 mg/dL (ref 100–199)
HDL: 53 mg/dL (ref >39)
LDL Chol Calc (NIH): 128 mg/dL — ABNORMAL HIGH (ref 0–99)
Triglycerides: 40 mg/dL (ref 0–149)
VLDL Cholesterol Cal: 8 mg/dL (ref 5–40)

## 2024-02-09 LAB — TSH+FREE T4
Free T4: 1.36 ng/dL (ref 0.82–1.77)
TSH: 2.42 u[IU]/mL (ref 0.450–4.500)

## 2024-02-09 LAB — VITAMIN D 25 HYDROXY (VIT D DEFICIENCY, FRACTURES): Vit D, 25-Hydroxy: 36.2 ng/mL (ref 30.0–100.0)

## 2024-02-09 NOTE — Progress Notes (Signed)
 Will discuss labs with patient at her visit next week

## 2024-02-11 ENCOUNTER — Other Ambulatory Visit: Payer: Self-pay | Admitting: Nurse Practitioner

## 2024-02-11 DIAGNOSIS — I10 Essential (primary) hypertension: Secondary | ICD-10-CM

## 2024-02-15 ENCOUNTER — Ambulatory Visit (INDEPENDENT_AMBULATORY_CARE_PROVIDER_SITE_OTHER): Payer: BC Managed Care – PPO | Admitting: Nurse Practitioner

## 2024-02-15 ENCOUNTER — Encounter: Payer: Self-pay | Admitting: Nurse Practitioner

## 2024-02-15 VITALS — BP 129/68 | HR 66 | Temp 98.2°F | Resp 16 | Ht 62.0 in | Wt 163.0 lb

## 2024-02-15 DIAGNOSIS — N952 Postmenopausal atrophic vaginitis: Secondary | ICD-10-CM | POA: Insufficient documentation

## 2024-02-15 DIAGNOSIS — Z1211 Encounter for screening for malignant neoplasm of colon: Secondary | ICD-10-CM

## 2024-02-15 DIAGNOSIS — Z1212 Encounter for screening for malignant neoplasm of rectum: Secondary | ICD-10-CM

## 2024-02-15 DIAGNOSIS — E559 Vitamin D deficiency, unspecified: Secondary | ICD-10-CM | POA: Diagnosis not present

## 2024-02-15 DIAGNOSIS — I10 Essential (primary) hypertension: Secondary | ICD-10-CM | POA: Diagnosis not present

## 2024-02-15 DIAGNOSIS — E039 Hypothyroidism, unspecified: Secondary | ICD-10-CM | POA: Diagnosis not present

## 2024-02-15 DIAGNOSIS — Z0001 Encounter for general adult medical examination with abnormal findings: Secondary | ICD-10-CM | POA: Diagnosis not present

## 2024-02-15 MED ORDER — TELMISARTAN-HCTZ 80-12.5 MG PO TABS: 1.0000 | ORAL_TABLET | Freq: Every day | ORAL | 1 refills | Status: AC

## 2024-02-15 MED ORDER — ESTRADIOL 0.1 MG/GM VA CREA: 1.0000 | TOPICAL_CREAM | Freq: Every day | VAGINAL | 12 refills | Status: AC

## 2024-02-15 MED ORDER — LEVOTHYROXINE SODIUM 50 MCG PO TABS: 50.0000 ug | ORAL_TABLET | Freq: Every day | ORAL | 1 refills | Status: AC

## 2024-02-15 NOTE — Progress Notes (Signed)
 [facility] [address] [place], [place] [address]  Internal MEDICINE  Office Visit Note  Patient Name: Theresa Yang  [id number]  [id number]  Date of Service: 02/15/2024  Chief Complaint  Patient presents with   Annual Exam   Hypertension   Quality Metric Gaps    Colonoscopy and Pneumonia vaccine    HPI [name] presents for an annual well visit and physical exam.  Well-appearing 58 y.o. female with hypothyroidism, hypertension and a heart murmur. And low vitamin D  Routine CRC screening: due now Routine mammogram: done in January this year  DEXA scan: not due yet  Pap smear: due now, had hysterectomy but they left the cervix.  Eye exam and/or foot exam: Labs: recent labs done, results reviewed today. All labs are normal except slightly elevated LDL.  New or worsening pain: none  Other concerns: none     Current Medication: Outpatient Encounter Medications as of 02/15/2024  Medication Sig   desonide (DESOWEN) 0.05 % cream Apply topically.   estradiol  (ESTRACE  VAGINAL) 0.1 MG/GM vaginal cream Place 1 Applicatorful vaginally at bedtime. X2 weeks then decrease to twice weekly at bedtime.   fluocinonide cream (LIDEX) 0.05 % Apply 1 application topically 2 (two) times daily.   nystatin  cream (MYCOSTATIN ) Apply 1 Application topically 2 (two) times daily. To affected area until resolved.   [DISCONTINUED] clotrimazole -betamethasone  (LOTRISONE ) cream Apply 1 Application topically 2 (two) times daily.   [DISCONTINUED] levothyroxine  (SYNTHROID ) 50 MCG tablet TAKE 1 TABLET BY MOUTH ONCE DAILY BEFORE BREAKFAST   [DISCONTINUED] telmisartan -hydrochlorothiazide (MICARDIS  HCT) 80-12.5 MG tablet Take 1 tablet by mouth once daily   levothyroxine  (SYNTHROID ) 50 MCG tablet Take 1 tablet (50 mcg total) by mouth daily before breakfast.   telmisartan -hydrochlorothiazide (MICARDIS  HCT) 80-12.5 MG tablet Take 1 tablet by mouth daily.   No facility-administered encounter  medications on file as of 02/15/2024.    Surgical History: Past Surgical History:  Procedure Laterality Date   ABDOMINAL HYSTERECTOMY     COLONOSCOPY WITH PROPOFOL  N/A 07/12/2018   Procedure: COLONOSCOPY WITH PROPOFOL ;  Surgeon: Therisa Bi, MD;  Location: [facility] ENDOSCOPY;  Service: Gastroenterology;  Laterality: N/A;    Medical History: Past Medical History:  Diagnosis Date   Complication of anesthesia    Hypertension    Hypothyroidism    PONV (postoperative nausea and vomiting)    Thyroid disease     Family History: Family History  Problem Relation Age of Onset   Diabetes Mother    Hypothyroidism Father    Cancer Father    Breast cancer Neg Hx     Social History   Socioeconomic History   Marital status: Single    Spouse name: Not on file   Number of children: Not on file   Years of education: Not on file   Highest education level: Not on file  Occupational History   Not on file  Tobacco Use   Smoking status: Never   Smokeless tobacco: Never  Vaping Use   Vaping status: Never Used  Substance and Sexual Activity   Alcohol use: No   Drug use: No   Sexual activity: Not on file  Other Topics Concern   Not on file  Social History Narrative   Not on file   Social Drivers of Health   Financial Resource Strain: Not on file  Food Insecurity: Not on file  Transportation Needs: Not on file  Physical Activity: Not on file  Stress: Not on file  Social Connections: Not on file  Intimate Partner Violence: Not on file      Review of Systems  Constitutional:  Negative for activity change, appetite change, chills, fatigue, fever and unexpected weight change.  HENT: Negative.  Negative for congestion, ear pain, rhinorrhea, sore throat and trouble swallowing.   Eyes: Negative.   Respiratory: Negative.  Negative for cough, chest tightness, shortness of breath and wheezing.   Cardiovascular: Negative.  Negative for chest pain.  Gastrointestinal: Negative.  Negative  for abdominal pain, blood in stool, constipation, diarrhea, nausea and vomiting.  Endocrine: Negative.   Genitourinary: Negative.  Negative for difficulty urinating, dysuria, frequency, hematuria and urgency.  Musculoskeletal: Negative.  Negative for arthralgias, back pain, joint swelling, myalgias and neck pain.  Skin: Negative.  Negative for rash and wound.  Allergic/Immunologic: Negative.  Negative for immunocompromised state.  Neurological: Negative.  Negative for dizziness, seizures, numbness and headaches.  Hematological: Negative.   Psychiatric/Behavioral: Negative.  Negative for behavioral problems, self-injury and suicidal ideas. The patient is not nervous/anxious.     Vital Signs: BP 129/68   Pulse 66   Temp 98.2 F (36.8 C)   Resp 16   Ht 5' 2 (1.575 m)   Wt 163 lb (73.9 kg)   SpO2 100%   BMI 29.81 kg/m    Physical Exam Vitals reviewed.  Constitutional:      General: She is awake. She is not in acute distress.    Appearance: Normal appearance. She is well-developed, well-groomed and overweight. She is not ill-appearing or diaphoretic.  HENT:     Head: Normocephalic and atraumatic.     Right Ear: Tympanic membrane and external ear normal. There is no impacted cerumen.     Left Ear: Tympanic membrane, ear canal and external ear normal. There is no impacted cerumen.     Nose: Nose normal. No congestion or rhinorrhea.     Mouth/Throat:     Lips: Pink.     Mouth: Mucous membranes are moist.     Pharynx: Oropharynx is clear. Uvula midline. No oropharyngeal exudate or posterior oropharyngeal erythema.  Eyes:     General: Lids are normal. Vision grossly intact. Gaze aligned appropriately. No scleral icterus.       Right eye: No discharge.        Left eye: No discharge.     Extraocular Movements: Extraocular movements intact.     Conjunctiva/sclera: Conjunctivae normal.     Pupils: Pupils are equal, round, and reactive to light.     Funduscopic exam:    Right eye: Red  reflex present.        Left eye: Red reflex present. Neck:     Thyroid: No thyromegaly.     Vascular: No JVD.     Trachea: Trachea and phonation normal. No tracheal deviation.  Cardiovascular:     Rate and Rhythm: Normal rate and regular rhythm.     Pulses:          Carotid pulses are 3+ on the right side and 3+ on the left side.      Radial pulses are 2+ on the right side and 2+ on the left side.       Posterior tibial pulses are 2+ on the right side and 2+ on the left side.     Heart sounds: Normal heart sounds, S1 normal and S2 normal. No murmur heard.    No friction rub. No gallop.  Pulmonary:     Effort: Pulmonary effort is normal. No accessory muscle usage or respiratory  distress.     Breath sounds: Normal breath sounds and air entry. No stridor. No wheezing or rales.  Chest:     Chest wall: No tenderness.  Breasts:    Breasts are symmetrical.     Right: Normal. No swelling, bleeding, inverted nipple, mass, nipple discharge, skin change or tenderness.     Left: Normal. No swelling, bleeding, inverted nipple, mass, nipple discharge, skin change or tenderness.  Abdominal:     General: Bowel sounds are normal. There is no distension.     Palpations: Abdomen is soft. There is no mass.     Tenderness: There is no abdominal tenderness. There is no guarding or rebound.  Musculoskeletal:        General: No tenderness or deformity. Normal range of motion.     Cervical back: Normal range of motion and neck supple.     Right lower leg: No edema.     Left lower leg: No edema.  Lymphadenopathy:     Cervical: No cervical adenopathy.     Upper Body:     Right upper body: No supraclavicular, axillary or pectoral adenopathy.     Left upper body: No supraclavicular, axillary or pectoral adenopathy.  Skin:    General: Skin is warm and dry.     Capillary Refill: Capillary refill takes less than 2 seconds.     Coloration: Skin is not pale.     Findings: No erythema or rash.  Neurological:      Mental Status: She is alert and oriented to person, place, and time.     Cranial Nerves: No cranial nerve deficit.     Motor: No abnormal muscle tone.     Coordination: Coordination normal.     Gait: Gait normal.     Deep Tendon Reflexes: Reflexes are normal and symmetric.  Psychiatric:        Mood and Affect: Mood and affect normal.        Behavior: Behavior normal. Behavior is cooperative.        Thought Content: Thought content normal.        Judgment: Judgment normal.        Assessment/Plan: 1. Encounter for routine adult health examination with abnormal findings (Primary) Age-appropriate preventive screenings and vaccinations discussed, annual physical exam completed. Routine labs for health maintenance results discussed with the patient today. PHM updated.    2. Essential hypertension Stable, continue telmisartan -hydrochlorothiazide as prescribed.  - telmisartan -hydrochlorothiazide (MICARDIS  HCT) 80-12.5 MG tablet; Take 1 tablet by mouth daily.  Dispense: 90 tablet; Refill: 1  3. Acquired hypothyroidism Continue levothyroxine  as prescribed.  - levothyroxine  (SYNTHROID ) 50 MCG tablet; Take 1 tablet (50 mcg total) by mouth daily before breakfast.  Dispense: 90 tablet; Refill: 1  4. Atrophic vaginitis Vaginal estradiol  cream prescribed, use as ordered.  - estradiol  (ESTRACE  VAGINAL) 0.1 MG/GM vaginal cream; Place 1 Applicatorful vaginally at bedtime. X2 weeks then decrease to twice weekly at bedtime.  Dispense: 42.5 g; Refill: 12  5. Vitamin D  deficiency Level is now in normal range. May continue OTC vitamin D  supplement if desired.   6. Screening for colorectal cancer Referred to Dr. Therisa at [facility] GI for routine colonoscopy.  - Ambulatory referral to Gastroenterology     General Counseling: jayma volpi understanding of the findings of todays visit and agrees with plan of treatment. I have discussed any further diagnostic evaluation that may  be needed or ordered today. We also reviewed her medications today. she has been encouraged  to call the office with any questions or concerns that should arise related to todays visit.    Orders Placed This Encounter  Procedures   Ambulatory referral to Gastroenterology    Meds ordered this encounter  Medications   estradiol  (ESTRACE  VAGINAL) 0.1 MG/GM vaginal cream    Sig: Place 1 Applicatorful vaginally at bedtime. X2 weeks then decrease to twice weekly at bedtime.    Dispense:  42.5 g    Refill:  12    Fill new script today   levothyroxine  (SYNTHROID ) 50 MCG tablet    Sig: Take 1 tablet (50 mcg total) by mouth daily before breakfast.    Dispense:  90 tablet    Refill:  1    For future refills   telmisartan -hydrochlorothiazide (MICARDIS  HCT) 80-12.5 MG tablet    Sig: Take 1 tablet by mouth daily.    Dispense:  90 tablet    Refill:  1    For future refills, keep on file. She just recently picked up her refill.    Return in about 6 months (around 08/14/2024) for F/U, PAP ONLY, Janay Canan PCP and otherwise as needed. .   Total time spent:30 Minutes Time spent includes review of chart, medications, test results, and follow up plan with the patient.   [place] Controlled Substance Database was reviewed by me.  This patient was seen by Mardy Maxin, FNP-C in collaboration with Dr. Sigrid Bathe as a part of collaborative care agreement.  Peggye Poon R. Maxin, MSN, FNP-C Internal medicine

## 2024-02-16 ENCOUNTER — Telehealth: Payer: Self-pay | Admitting: Nurse Practitioner

## 2024-02-16 NOTE — Telephone Encounter (Signed)
 Gastroenterology referral sent via [facility] to [facility]. Lvm notifying patient. Gave telephone # [phone]

## 2024-02-20 ENCOUNTER — Telehealth: Payer: Self-pay

## 2024-02-20 ENCOUNTER — Other Ambulatory Visit: Payer: Self-pay

## 2024-02-20 MED ORDER — NITROFURANTOIN MONOHYD MACRO 100 MG PO CAPS: 100.0000 mg | ORAL_CAPSULE | Freq: Two times a day (BID) | ORAL | 0 refills | Status: AC

## 2024-02-20 NOTE — Telephone Encounter (Signed)
 Pt called that she is having UTI symptoms ,lower back pain as per alyssa  sent Macrobid 

## 2024-03-07 ENCOUNTER — Telehealth: Payer: Self-pay | Admitting: Nurse Practitioner

## 2024-03-07 NOTE — Telephone Encounter (Signed)
 Per Delon w/ [facility], referral has been closed due to patient declined to schedule an appointment. Wants to wait awhile-Toni

## 2024-03-15 ENCOUNTER — Telehealth: Payer: Self-pay | Admitting: Internal Medicine

## 2024-03-15 ENCOUNTER — Encounter: Payer: Self-pay | Admitting: Internal Medicine

## 2024-03-15 ENCOUNTER — Ambulatory Visit (INDEPENDENT_AMBULATORY_CARE_PROVIDER_SITE_OTHER): Admitting: Internal Medicine

## 2024-03-15 VITALS — BP 130/76 | HR 67 | Temp 97.0°F | Resp 16 | Ht 62.0 in | Wt 161.8 lb

## 2024-03-15 DIAGNOSIS — Z01419 Encounter for gynecological examination (general) (routine) without abnormal findings: Secondary | ICD-10-CM

## 2024-03-15 DIAGNOSIS — B3731 Acute candidiasis of vulva and vagina: Secondary | ICD-10-CM

## 2024-03-15 DIAGNOSIS — R39198 Other difficulties with micturition: Secondary | ICD-10-CM | POA: Diagnosis not present

## 2024-03-15 DIAGNOSIS — R319 Hematuria, unspecified: Secondary | ICD-10-CM | POA: Diagnosis not present

## 2024-03-15 DIAGNOSIS — R102 Pelvic and perineal pain unspecified side: Secondary | ICD-10-CM | POA: Diagnosis not present

## 2024-03-15 DIAGNOSIS — N390 Urinary tract infection, site not specified: Secondary | ICD-10-CM | POA: Diagnosis not present

## 2024-03-15 LAB — POCT URINALYSIS DIPSTICK
Bilirubin, UA: NEGATIVE
Glucose, UA: NEGATIVE
Nitrite, UA: NEGATIVE
Protein, UA: NEGATIVE
Spec Grav, UA: <=1.005 — AB (ref 1.010–1.025)
Urobilinogen, UA: 0.2 U/dL
pH, UA: 7 (ref 5.0–8.0)

## 2024-03-15 MED ORDER — FLUCONAZOLE 150 MG PO TABS: ORAL_TABLET | ORAL | 1 refills | Status: AC

## 2024-03-15 NOTE — Telephone Encounter (Signed)
Notified patient of U/S appointment date, arrival time, location and to arrive with full bladder-Toni

## 2024-03-15 NOTE — Progress Notes (Signed)
 [facility] [address] [place], [place] [address]  Internal MEDICINE  Office Visit Note  Patient Name: Theresa Yang  [id number]  [id number]  Date of Service: 03/19/2024  Chief Complaint  Patient presents with   Acute Visit    Vaginal area swollen.    HPI  Patient is seen for acute visit Patient has been having ongoing symptoms of dysuria and burning She was given Estrace   vaginal cream, patient has not used it yet however with the labia is swollen and irritated She is not sexually active, she is c/o pelvic pain   Current Medication: Outpatient Encounter Medications as of 03/15/2024  Medication Sig   fluconazole (DIFLUCAN) 150 MG tablet Take one tab a day for 5 days and then once week until finished   desonide (DESOWEN) 0.05 % cream Apply topically.   estradiol  (ESTRACE  VAGINAL) 0.1 MG/GM vaginal cream Place 1 Applicatorful vaginally at bedtime. X2 weeks then decrease to twice weekly at bedtime. (Patient not taking: Reported on 03/15/2024)   fluocinonide cream (LIDEX) 0.05 % Apply 1 application topically 2 (two) times daily. (Patient not taking: Reported on 03/15/2024)   levothyroxine  (SYNTHROID ) 50 MCG tablet Take 1 tablet (50 mcg total) by mouth daily before breakfast.   nitrofurantoin , macrocrystal-monohydrate, (MACROBID ) 100 MG capsule Take 1 capsule (100 mg total) by mouth 2 (two) times daily. (Patient not taking: Reported on 03/15/2024)   nystatin  cream (MYCOSTATIN ) Apply 1 Application topically 2 (two) times daily. To affected area until resolved.   telmisartan -hydrochlorothiazide (MICARDIS  HCT) 80-12.5 MG tablet Take 1 tablet by mouth daily.   No facility-administered encounter medications on file as of 03/15/2024.    Surgical History: Past Surgical History:  Procedure Laterality Date   ABDOMINAL HYSTERECTOMY     COLONOSCOPY WITH PROPOFOL  N/A 07/12/2018   Procedure: COLONOSCOPY WITH PROPOFOL ;  Surgeon: Therisa Bi, MD;  Location: [facility]  ENDOSCOPY;  Service: Gastroenterology;  Laterality: N/A;    Medical History: Past Medical History:  Diagnosis Date   Complication of anesthesia    Hypertension    Hypothyroidism    PONV (postoperative nausea and vomiting)    Thyroid disease     Family History: Family History  Problem Relation Age of Onset   Diabetes Mother    Hypothyroidism Father    Cancer Father    Breast cancer Neg Hx     Social History   Socioeconomic History   Marital status: Single    Spouse name: Not on file   Number of children: Not on file   Years of education: Not on file   Highest education level: Not on file  Occupational History   Not on file  Tobacco Use   Smoking status: Never   Smokeless tobacco: Never  Vaping Use   Vaping status: Never Used  Substance and Sexual Activity   Alcohol use: No   Drug use: No   Sexual activity: Not on file  Other Topics Concern   Not on file  Social History Narrative   Not on file   Social Drivers of Health   Financial Resource Strain: Not on file  Food Insecurity: Not on file  Transportation Needs: Not on file  Physical Activity: Not on file  Stress: Not on file  Social Connections: Not on file  Intimate Partner Violence: Not on file      Review of Systems  Constitutional:  Negative for fatigue and fever.  HENT:  Negative for congestion, mouth sores and postnasal drip.   Respiratory:  Negative  for cough.   Cardiovascular:  Negative for chest pain.  Genitourinary:  Negative for flank pain.  Psychiatric/Behavioral: Negative.      Vital Signs: BP 130/76   Pulse 67   Temp (!) 97 F (36.1 C)   Resp 16   Ht 5' 2 (1.575 m)   Wt 161 lb 12.8 oz (73.4 kg)   SpO2 98%   BMI 29.59 kg/m    Physical Exam Constitutional:      Appearance: Normal appearance.  Genitourinary:    Comments: Patient has swollen labia majora, and slight erythema Pap smear is done she does have whitish discharge, speculum was consistent with fungal  vaginitis Neurological:     Mental Status: She is alert.        Assessment/Plan: 1. Pap smear, as part of routine gynecological examination - IGP, Aptima HPV  2. Vagina, candidiasis Start therapy as prescribed  - fluconazole (DIFLUCAN) 150 MG tablet; Take one tab a day for 5 days and then once week until finished  Dispense: 10 tablet; Refill: 1  3. Pelvic pain - US  Pelvic Complete With Transvaginal; Future  4. Urinary tract infection with hematuria, site unspecified Await c/s - POCT urinalysis dipstick - CULTURE, URINE COMPREHENSIVE   General Counseling: Amere verbalizes understanding of the findings of todays visit and agrees with plan of treatment. I have discussed any further diagnostic evaluation that may be needed or ordered today. We also reviewed her medications today. she has been encouraged to call the office with any questions or concerns that should arise related to todays visit.    Orders Placed This Encounter  Procedures   CULTURE, URINE COMPREHENSIVE   US  Pelvic Complete With Transvaginal   POCT urinalysis dipstick    Meds ordered this encounter  Medications   fluconazole (DIFLUCAN) 150 MG tablet    Sig: Take one tab a day for 5 days and then once week until finished    Dispense:  10 tablet    Refill:  1    Total time spent:35 Minutes Time spent includes review of chart, medications, test results, and follow up plan with the patient.   [place] Controlled Substance Database was reviewed by me.   Dr Arlester Keehan M Jhonny Cal[facility] Internal medicine

## 2024-03-19 ENCOUNTER — Telehealth: Payer: Self-pay

## 2024-03-19 ENCOUNTER — Ambulatory Visit: Payer: Self-pay | Admitting: Internal Medicine

## 2024-03-19 ENCOUNTER — Other Ambulatory Visit: Payer: Self-pay | Admitting: Internal Medicine

## 2024-03-19 LAB — IGP, APTIMA HPV: HPV Aptima: NEGATIVE

## 2024-03-19 MED ORDER — CLOTRIMAZOLE-BETAMETHASONE 1-0.05 % EX CREA
TOPICAL_CREAM | CUTANEOUS | 0 refills | Status: AC
Start: 2024-03-19 — End: ?

## 2024-03-19 NOTE — Telephone Encounter (Signed)
 Left message for patient to give office a call back.

## 2024-03-19 NOTE — Telephone Encounter (Signed)
 Lets call in Lotrisone  apply to the affected area at night only externally, I am sending the RX

## 2024-03-19 NOTE — Telephone Encounter (Signed)
 Left patient a message to give office a call back, so I can check on her an see how she is feeling.

## 2024-03-19 NOTE — Progress Notes (Signed)
 Pap smear is reviewed, pt has excessive vaginal dryness due to menopause, will discuss treatment at her next follow up

## 2024-03-20 NOTE — Telephone Encounter (Signed)
 Patient notified

## 2024-03-20 NOTE — Telephone Encounter (Signed)
-----   Message from [facility] sent at 03/19/2024  3:58 PM EDT ----- Pap smear is reviewed, pt has excessive vaginal dryness due to menopause, will discuss treatment at her next follow up   ----- Message ----- From: Donelda Jain, CMA Sent: 03/15/2024   2:35 PM EDT To: Sigrid CHRISTELLA Bathe, MD

## 2024-03-21 ENCOUNTER — Other Ambulatory Visit: Payer: Self-pay

## 2024-03-21 ENCOUNTER — Ambulatory Visit
Admission: RE | Admit: 2024-03-21 | Discharge: 2024-03-21 | Disposition: A | Discharge status: Home / Self Care | Source: Ambulatory Visit | Attending: Internal Medicine | Admitting: Internal Medicine

## 2024-03-21 DIAGNOSIS — R102 Pelvic and perineal pain unspecified side: Secondary | ICD-10-CM | POA: Diagnosis present

## 2024-03-21 LAB — CULTURE, URINE COMPREHENSIVE

## 2024-03-21 MED ORDER — CIPROFLOXACIN HCL 500 MG PO TABS
500.0000 mg | ORAL_TABLET | Freq: Two times a day (BID) | ORAL | 0 refills | Status: AC
Start: 2024-03-21 — End: 2024-03-31

## 2024-03-21 NOTE — Telephone Encounter (Signed)
-----   Message from [facility] sent at 03/21/2024 11:57 AM EDT ----- Pts does have UTI, please call in cipro 500 mg po bid x 10 days #20  ----- Message ----- From: Donelda Jain, CMA Sent: 03/15/2024   2:35 PM EDT To: Sigrid CHRISTELLA Bathe, MD

## 2024-03-21 NOTE — Progress Notes (Signed)
 Pts does have UTI, please call in cipro 500 mg po bid x 10 days #20

## 2024-03-21 NOTE — Telephone Encounter (Signed)
Left patient a message to give office a callback.Marland Kitchen

## 2024-03-21 NOTE — Telephone Encounter (Signed)
 Patient notified

## 2024-03-28 NOTE — Progress Notes (Signed)
 Pt needs to see urology for urinary retention

## 2024-03-28 NOTE — Addendum Note (Signed)
 Addended by: FERNAND SIGRID HERO on: 03/28/2024 02:37 PM   Modules accepted: Orders

## 2024-08-15 ENCOUNTER — Ambulatory Visit: Admitting: Nurse Practitioner

## 2025-02-15 ENCOUNTER — Encounter: Admitting: Nurse Practitioner
